# Patient Record
Sex: Male | Born: 1945 | Race: White | Hispanic: No | Marital: Married | Smoking: Never smoker
Health system: Southern US, Community
[De-identification: ages and names within clinical notes are randomized; demographics above are authoritative.]

## PROBLEM LIST (undated history)

## (undated) DIAGNOSIS — M1990 Unspecified osteoarthritis, unspecified site: Secondary | ICD-10-CM

## (undated) DIAGNOSIS — I10 Essential (primary) hypertension: Secondary | ICD-10-CM

## (undated) DIAGNOSIS — K429 Umbilical hernia without obstruction or gangrene: Secondary | ICD-10-CM

## (undated) DIAGNOSIS — G4730 Sleep apnea, unspecified: Secondary | ICD-10-CM

## (undated) HISTORY — PX: EYE SURGERY: SHX253

## (undated) HISTORY — PX: OTHER SURGICAL HISTORY: SHX169

---

## 2014-08-22 HISTORY — PX: JOINT REPLACEMENT: SHX530

## 2016-08-22 HISTORY — PX: REPLACEMENT TOTAL KNEE: SUR1224

## 2018-07-02 ENCOUNTER — Other Ambulatory Visit: Payer: Self-pay | Admitting: Neurological Surgery

## 2018-08-23 NOTE — Pre-Procedure Instructions (Signed)
Joe Ramos  08/23/2018      [facility] - [place], [place] - [address] [place] [address] Phone: [phone] Fax: [phone]    Your procedure is scheduled on January 8  Report to [facility] [employer] A.M.  Call this number if you have problems the morning of surgery:  [phone]   Remember:  Do not eat or drink after midnight.      Take these medicines the morning of surgery with A SIP OF WATER NONE  7 days prior to surgery STOP taking any Aspirin (unless otherwise instructed by your surgeon), Aleve, Naproxen, Ibuprofen, Motrin, Advil, Goody's, BC's, all herbal medications, fish oil, and all vitamins.   Follow your surgeon's instructions on when to stop Asprin.  If no instructions were given by your surgeon then you will need to call the office to get those instructions.      Do not wear jewelry  Do not wear lotions, powders, or cologne, or deodorant.   Men may shave face and neck.  Do not bring valuables to the hospital.  [facility] is not responsible for any belongings or valuables.  Contacts, dentures or bridgework may not be worn into surgery.  Leave your suitcase in the car.  After surgery it may be brought to your room.  For patients admitted to the hospital, discharge time will be determined by your treatment team.  Patients discharged the day of surgery will not be allowed to drive home.    Special instructions:   [facility]- Preparing For Surgery  Before surgery, you can play an important role. Because skin is not sterile, your skin needs to be as free of germs as possible. You can reduce the number of germs on your skin by washing with CHG (chlorahexidine gluconate) Soap before surgery.  CHG is an antiseptic cleaner which kills germs and bonds with the skin to continue killing germs even after washing.    Oral Hygiene is also important to reduce your risk of infection.  Remember - BRUSH YOUR  TEETH THE MORNING OF SURGERY WITH YOUR REGULAR TOOTHPASTE  Please do not use if you have an allergy to CHG or antibacterial soaps. If your skin becomes reddened/irritated stop using the CHG.  Do not shave (including legs and underarms) for at least 48 hours prior to first CHG shower. It is OK to shave your face.  Please follow these instructions carefully.   1. Shower the NIGHT BEFORE SURGERY and the MORNING OF SURGERY with CHG.   2. If you chose to wash your hair, wash your hair first as usual with your normal shampoo.  3. After you shampoo, rinse your hair and body thoroughly to remove the shampoo.  4. Use CHG as you would any other liquid soap. You can apply CHG directly to the skin and wash gently with a scrungie or a clean washcloth.   5. Apply the CHG Soap to your body ONLY FROM THE NECK DOWN.  Do not use on open wounds or open sores. Avoid contact with your eyes, ears, mouth and genitals (private parts). Wash Face and genitals (private parts)  with your normal soap.  6. Wash thoroughly, paying special attention to the area where your surgery will be performed.  7. Thoroughly rinse your body with warm water from the neck down.  8. DO NOT shower/wash with your normal soap after using and rinsing off the CHG Soap.  9. Joe Ramos  yourself dry with a CLEAN TOWEL.  10. Wear CLEAN PAJAMAS to bed the night before surgery, wear comfortable clothes the morning of surgery  11. Place CLEAN SHEETS on your bed the night of your first shower and DO NOT SLEEP WITH PETS.    Day of Surgery:  Do not apply any deodorants/lotions.  Please wear clean clothes to the hospital/surgery center.   Remember to brush your teeth WITH YOUR REGULAR TOOTHPASTE.    Please read over the following fact sheets that you were given.

## 2018-08-24 ENCOUNTER — Other Ambulatory Visit: Payer: Self-pay

## 2018-08-24 ENCOUNTER — Encounter (HOSPITAL_COMMUNITY)
Admission: RE | Admit: 2018-08-24 | Discharge: 2018-08-24 | Disposition: A | Discharge status: Home / Self Care | Payer: Medicare Other | Source: Ambulatory Visit | Attending: Neurological Surgery | Admitting: Neurological Surgery

## 2018-08-24 ENCOUNTER — Ambulatory Visit (HOSPITAL_COMMUNITY)
Admission: RE | Admit: 2018-08-24 | Discharge: 2018-08-24 | Disposition: A | Discharge status: Home / Self Care | Payer: Medicare Other | Source: Ambulatory Visit | Attending: Neurological Surgery | Admitting: Neurological Surgery

## 2018-08-24 DIAGNOSIS — M4310 Spondylolisthesis, site unspecified: Secondary | ICD-10-CM

## 2018-08-24 LAB — CBC WITH DIFFERENTIAL/PLATELET
Abs Immature Granulocytes: 0.02 10*3/uL (ref 0.00–0.07)
BASOS ABS: 0 10*3/uL (ref 0.0–0.1)
Basophils Relative: 1 %
EOS ABS: 0.2 10*3/uL (ref 0.0–0.5)
Eosinophils Relative: 3 %
HCT: 42.3 % (ref 39.0–52.0)
Hemoglobin: 13.9 g/dL (ref 13.0–17.0)
Immature Granulocytes: 0 %
Lymphocytes Relative: 21 %
Lymphs Abs: 1.4 10*3/uL (ref 0.7–4.0)
MCH: 30.3 pg (ref 26.0–34.0)
MCHC: 32.9 g/dL (ref 30.0–36.0)
MCV: 92.4 fL (ref 80.0–100.0)
Monocytes Absolute: 0.7 10*3/uL (ref 0.1–1.0)
Monocytes Relative: 11 %
NRBC: 0 % (ref 0.0–0.2)
Neutro Abs: 4.2 10*3/uL (ref 1.7–7.7)
Neutrophils Relative %: 64 %
PLATELETS: 185 10*3/uL (ref 150–400)
RBC: 4.58 MIL/uL (ref 4.22–5.81)
RDW: 12.6 % (ref 11.5–15.5)
WBC: 6.5 10*3/uL (ref 4.0–10.5)

## 2018-08-24 LAB — SURGICAL PCR SCREEN
MRSA, PCR: NEGATIVE
Staphylococcus aureus: NEGATIVE

## 2018-08-24 LAB — TYPE AND SCREEN
ABO/RH(D): A POS
Antibody Screen: NEGATIVE

## 2018-08-24 LAB — PROTIME-INR
INR: 1.16
Prothrombin Time: 14.7 seconds (ref 11.4–15.2)

## 2018-08-24 LAB — ABO/RH: ABO/RH(D): A POS

## 2018-08-24 NOTE — Progress Notes (Signed)
PCP - [facility] Cardiologist - Dr. Blanch Media  Chest x-ray - 08/24/18 EKG - 08/24/18 Stress Test - denies ECHO - requesting Cardiac Cath - denies  Aspirin Instructions:stop 5 days  Anesthesia review: records requested  Patient denies shortness of breath, fever, cough and chest pain at PAT appointment   Patient verbalized understanding of instructions that were given to them at the PAT appointment. Patient was also instructed that they will need to review over the PAT instructions again at home before surgery.

## 2018-08-27 NOTE — Anesthesia Preprocedure Evaluation (Addendum)
Anesthesia Evaluation  Patient identified by MRN, date of birth, ID band Patient awake    Reviewed: Allergy & Precautions, NPO status , Patient's Chart, lab work & pertinent test results  Airway Mallampati: I  TM Distance: >3 FB Neck ROM: Full    Dental   Pulmonary sleep apnea ,    Pulmonary exam normal        Cardiovascular hypertension, Pt. on medications Normal cardiovascular exam     Neuro/Psych    GI/Hepatic   Endo/Other    Renal/GU      Musculoskeletal   Abdominal   Peds  Hematology   Anesthesia Other Findings   Reproductive/Obstetrics                             Anesthesia Physical Anesthesia Plan  ASA: III  Anesthesia Plan: General   Post-op Pain Management:    Induction: Intravenous  PONV Risk Score and Plan: 2 and Ondansetron, Midazolam and Dexamethasone  Airway Management Planned: Oral ETT  Additional Equipment:   Intra-op Plan:   Post-operative Plan: Extubation in OR  Informed Consent: I have reviewed the patients History and Physical, chart, labs and discussed the procedure including the risks, benefits and alternatives for the proposed anesthesia with the patient or authorized representative who has indicated his/her understanding and acceptance.     Plan Discussed with: CRNA and Surgeon  Anesthesia Plan Comments: (Pt follows with cardiologist in [place], Dr. Rinaldo Ratel, for management of HTN and HLD. Per last OV note 08/28/2017 he has no cardiac history, only seen for risk factor modification.)       Anesthesia Quick Evaluation

## 2018-08-29 ENCOUNTER — Inpatient Hospital Stay (HOSPITAL_COMMUNITY): Payer: Medicare Other | Admitting: Certified Registered Nurse Anesthetist

## 2018-08-29 ENCOUNTER — Inpatient Hospital Stay (HOSPITAL_COMMUNITY): Payer: Medicare Other

## 2018-08-29 ENCOUNTER — Inpatient Hospital Stay (HOSPITAL_COMMUNITY)
Admission: RE | Disposition: A | Discharge status: Home / Self Care | Payer: Self-pay | Source: Home / Self Care | Attending: Neurological Surgery

## 2018-08-29 ENCOUNTER — Encounter (HOSPITAL_COMMUNITY): Payer: Self-pay | Admitting: *Deleted

## 2018-08-29 ENCOUNTER — Inpatient Hospital Stay (HOSPITAL_COMMUNITY)
Admission: RE | Admit: 2018-08-29 | Discharge: 2018-08-30 | DRG: 455 | Disposition: A | Discharge status: Home / Self Care | Payer: Medicare Other | Attending: Neurological Surgery | Admitting: Neurological Surgery

## 2018-08-29 ENCOUNTER — Other Ambulatory Visit: Payer: Self-pay

## 2018-08-29 ENCOUNTER — Inpatient Hospital Stay (HOSPITAL_COMMUNITY): Payer: Medicare Other | Admitting: Physician Assistant

## 2018-08-29 DIAGNOSIS — I10 Essential (primary) hypertension: Secondary | ICD-10-CM | POA: Diagnosis present

## 2018-08-29 DIAGNOSIS — Z419 Encounter for procedure for purposes other than remedying health state, unspecified: Secondary | ICD-10-CM

## 2018-08-29 DIAGNOSIS — Z981 Arthrodesis status: Secondary | ICD-10-CM

## 2018-08-29 DIAGNOSIS — Z888 Allergy status to other drugs, medicaments and biological substances status: Secondary | ICD-10-CM | POA: Diagnosis not present

## 2018-08-29 DIAGNOSIS — Z96653 Presence of artificial knee joint, bilateral: Secondary | ICD-10-CM | POA: Diagnosis present

## 2018-08-29 DIAGNOSIS — M4316 Spondylolisthesis, lumbar region: Principal | ICD-10-CM | POA: Diagnosis present

## 2018-08-29 DIAGNOSIS — Z79899 Other long term (current) drug therapy: Secondary | ICD-10-CM | POA: Diagnosis not present

## 2018-08-29 DIAGNOSIS — G4730 Sleep apnea, unspecified: Secondary | ICD-10-CM | POA: Diagnosis present

## 2018-08-29 DIAGNOSIS — Z885 Allergy status to narcotic agent status: Secondary | ICD-10-CM

## 2018-08-29 DIAGNOSIS — Z7982 Long term (current) use of aspirin: Secondary | ICD-10-CM | POA: Diagnosis not present

## 2018-08-29 DIAGNOSIS — M48061 Spinal stenosis, lumbar region without neurogenic claudication: Secondary | ICD-10-CM | POA: Diagnosis present

## 2018-08-29 HISTORY — DX: Sleep apnea, unspecified: G47.30

## 2018-08-29 HISTORY — DX: Essential (primary) hypertension: I10

## 2018-08-29 HISTORY — DX: Umbilical hernia without obstruction or gangrene: K42.9

## 2018-08-29 HISTORY — DX: Unspecified osteoarthritis, unspecified site: M19.90

## 2018-08-29 SURGERY — POSTERIOR LUMBAR FUSION 1 LEVEL
Anesthesia: General | Site: Spine Lumbar

## 2018-08-29 MED ORDER — METHOCARBAMOL 500 MG PO TABS
500.0000 mg | ORAL_TABLET | Freq: Four times a day (QID) | ORAL | Status: DC | PRN
  Administered 2018-08-30: 500 mg via ORAL
  Filled 2018-08-29: qty 1

## 2018-08-29 MED ORDER — SUGAMMADEX SODIUM 200 MG/2ML IV SOLN
INTRAVENOUS | Status: DC | PRN
  Administered 2018-08-29: 400 mg via INTRAVENOUS

## 2018-08-29 MED ORDER — PHENYLEPHRINE 40 MCG/ML (10ML) SYRINGE FOR IV PUSH (FOR BLOOD PRESSURE SUPPORT)
PREFILLED_SYRINGE | INTRAVENOUS | Status: AC
  Filled 2018-08-29: qty 10

## 2018-08-29 MED ORDER — CHLORHEXIDINE GLUCONATE CLOTH 2 % EX PADS: 6.0000 | MEDICATED_PAD | Freq: Once | CUTANEOUS | Status: DC

## 2018-08-29 MED ORDER — THROMBIN 20000 UNITS EX SOLR
CUTANEOUS | Status: DC | PRN
  Administered 2018-08-29: 20 mL via TOPICAL

## 2018-08-29 MED ORDER — CELECOXIB 200 MG PO CAPS
200.0000 mg | ORAL_CAPSULE | Freq: Two times a day (BID) | ORAL | Status: DC
  Administered 2018-08-29 – 2018-08-30 (×3): 200 mg via ORAL
  Filled 2018-08-29 (×3): qty 1

## 2018-08-29 MED ORDER — HEPARIN SODIUM (PORCINE) 1000 UNIT/ML IJ SOLN
INTRAMUSCULAR | Status: DC | PRN
  Administered 2018-08-29: 50000 [IU] via INTRAVENOUS

## 2018-08-29 MED ORDER — OXYCODONE HCL 5 MG PO TABS: 5.0000 mg | ORAL_TABLET | ORAL | Status: DC | PRN

## 2018-08-29 MED ORDER — ONDANSETRON HCL 4 MG/2ML IJ SOLN
INTRAMUSCULAR | Status: DC | PRN
  Administered 2018-08-29: 4 mg via INTRAVENOUS

## 2018-08-29 MED ORDER — FENTANYL CITRATE (PF) 250 MCG/5ML IJ SOLN
INTRAMUSCULAR | Status: AC
  Filled 2018-08-29: qty 5

## 2018-08-29 MED ORDER — MIDAZOLAM HCL 2 MG/2ML IJ SOLN
INTRAMUSCULAR | Status: AC
  Filled 2018-08-29: qty 2

## 2018-08-29 MED ORDER — HYDROCHLOROTHIAZIDE 12.5 MG PO CAPS
12.5000 mg | ORAL_CAPSULE | Freq: Every day | ORAL | Status: DC
  Administered 2018-08-29: 12.5 mg via ORAL
  Filled 2018-08-29: qty 1

## 2018-08-29 MED ORDER — ONDANSETRON HCL 4 MG/2ML IJ SOLN: 4.0000 mg | Freq: Four times a day (QID) | INTRAMUSCULAR | Status: DC | PRN

## 2018-08-29 MED ORDER — CEFAZOLIN SODIUM-DEXTROSE 2-4 GM/100ML-% IV SOLN
2.0000 g | Freq: Three times a day (TID) | INTRAVENOUS | Status: AC
  Administered 2018-08-29 (×2): 2 g via INTRAVENOUS
  Filled 2018-08-29 (×2): qty 100

## 2018-08-29 MED ORDER — BUPIVACAINE HCL (PF) 0.25 % IJ SOLN
INTRAMUSCULAR | Status: AC
  Filled 2018-08-29: qty 30

## 2018-08-29 MED ORDER — ACETAMINOPHEN 650 MG RE SUPP: 650.0000 mg | RECTAL | Status: DC | PRN

## 2018-08-29 MED ORDER — ROCURONIUM BROMIDE 100 MG/10ML IV SOLN
INTRAVENOUS | Status: DC | PRN
  Administered 2018-08-29: 60 mg via INTRAVENOUS
  Administered 2018-08-29: 40 mg via INTRAVENOUS

## 2018-08-29 MED ORDER — PROPOFOL 10 MG/ML IV BOLUS
INTRAVENOUS | Status: AC
  Filled 2018-08-29: qty 20

## 2018-08-29 MED ORDER — EZETIMIBE-SIMVASTATIN 10-20 MG PO TABS
1.0000 | ORAL_TABLET | Freq: Every day | ORAL | Status: DC
  Administered 2018-08-29: 1 via ORAL
  Filled 2018-08-29 (×2): qty 1

## 2018-08-29 MED ORDER — SODIUM CHLORIDE 0.9 % IV SOLN
INTRAVENOUS | Status: DC | PRN
  Administered 2018-08-29: 500 mL

## 2018-08-29 MED ORDER — HYDROMORPHONE HCL 1 MG/ML IJ SOLN
INTRAMUSCULAR | Status: AC
  Administered 2018-08-29: 0.5 mg via INTRAVENOUS
  Filled 2018-08-29: qty 1

## 2018-08-29 MED ORDER — SODIUM CHLORIDE 0.9% FLUSH: 3.0000 mL | INTRAVENOUS | Status: DC | PRN

## 2018-08-29 MED ORDER — LOSARTAN POTASSIUM 50 MG PO TABS
50.0000 mg | ORAL_TABLET | Freq: Every day | ORAL | Status: DC
  Administered 2018-08-29: 50 mg via ORAL
  Filled 2018-08-29: qty 1

## 2018-08-29 MED ORDER — THROMBIN 20000 UNITS EX SOLR
CUTANEOUS | Status: AC
  Filled 2018-08-29: qty 20000

## 2018-08-29 MED ORDER — VANCOMYCIN HCL 1000 MG IV SOLR
INTRAVENOUS | Status: DC | PRN
  Administered 2018-08-29: 1000 mg via TOPICAL

## 2018-08-29 MED ORDER — VANCOMYCIN HCL 1000 MG IV SOLR
INTRAVENOUS | Status: AC
  Filled 2018-08-29: qty 1000

## 2018-08-29 MED ORDER — SODIUM CHLORIDE 0.9% FLUSH
3.0000 mL | Freq: Two times a day (BID) | INTRAVENOUS | Status: DC
  Administered 2018-08-29: 3 mL via INTRAVENOUS

## 2018-08-29 MED ORDER — LIDOCAINE 2% (20 MG/ML) 5 ML SYRINGE
INTRAMUSCULAR | Status: AC
  Filled 2018-08-29: qty 5

## 2018-08-29 MED ORDER — MORPHINE SULFATE (PF) 2 MG/ML IV SOLN: 2.0000 mg | INTRAVENOUS | Status: DC | PRN

## 2018-08-29 MED ORDER — METHOCARBAMOL 1000 MG/10ML IJ SOLN
500.0000 mg | Freq: Four times a day (QID) | INTRAVENOUS | Status: DC | PRN
  Filled 2018-08-29: qty 5

## 2018-08-29 MED ORDER — EPHEDRINE 5 MG/ML INJ
INTRAVENOUS | Status: AC
  Filled 2018-08-29: qty 10

## 2018-08-29 MED ORDER — LIDOCAINE HCL (CARDIAC) PF 100 MG/5ML IV SOSY
PREFILLED_SYRINGE | INTRAVENOUS | Status: DC | PRN
  Administered 2018-08-29: 80 mg via INTRAVENOUS

## 2018-08-29 MED ORDER — DEXAMETHASONE SODIUM PHOSPHATE 10 MG/ML IJ SOLN
10.0000 mg | INTRAMUSCULAR | Status: AC
  Administered 2018-08-29: 10 mg via INTRAVENOUS
  Filled 2018-08-29: qty 1

## 2018-08-29 MED ORDER — HYDROMORPHONE HCL 1 MG/ML IJ SOLN
0.5000 mg | INTRAMUSCULAR | Status: DC | PRN
  Administered 2018-08-29 (×2): 0.5 mg via INTRAVENOUS

## 2018-08-29 MED ORDER — NON FORMULARY
Status: DC | PRN
  Administered 2018-08-29: 10 mL

## 2018-08-29 MED ORDER — ONDANSETRON HCL 4 MG/2ML IJ SOLN
INTRAMUSCULAR | Status: AC
  Filled 2018-08-29: qty 2

## 2018-08-29 MED ORDER — PHENOL 1.4 % MT LIQD: 1.0000 | OROMUCOSAL | Status: DC | PRN

## 2018-08-29 MED ORDER — DEXAMETHASONE SODIUM PHOSPHATE 4 MG/ML IJ SOLN
4.0000 mg | Freq: Four times a day (QID) | INTRAMUSCULAR | Status: DC
  Administered 2018-08-29: 4 mg via INTRAVENOUS
  Filled 2018-08-29: qty 1

## 2018-08-29 MED ORDER — MIDAZOLAM HCL 5 MG/5ML IJ SOLN
INTRAMUSCULAR | Status: DC | PRN
  Administered 2018-08-29: 2 mg via INTRAVENOUS

## 2018-08-29 MED ORDER — ONDANSETRON HCL 4 MG PO TABS: 4.0000 mg | ORAL_TABLET | Freq: Four times a day (QID) | ORAL | Status: DC | PRN

## 2018-08-29 MED ORDER — THROMBIN 5000 UNITS EX SOLR
OROMUCOSAL | Status: DC | PRN
  Administered 2018-08-29: 5 mL via TOPICAL

## 2018-08-29 MED ORDER — ROCURONIUM BROMIDE 50 MG/5ML IV SOSY
PREFILLED_SYRINGE | INTRAVENOUS | Status: AC
  Filled 2018-08-29: qty 10

## 2018-08-29 MED ORDER — POTASSIUM CHLORIDE IN NACL 20-0.9 MEQ/L-% IV SOLN: INTRAVENOUS | Status: DC

## 2018-08-29 MED ORDER — SENNA 8.6 MG PO TABS
1.0000 | ORAL_TABLET | Freq: Two times a day (BID) | ORAL | Status: DC
  Administered 2018-08-29 (×2): 8.6 mg via ORAL
  Filled 2018-08-29 (×2): qty 1

## 2018-08-29 MED ORDER — 0.9 % SODIUM CHLORIDE (POUR BTL) OPTIME
TOPICAL | Status: DC | PRN
  Administered 2018-08-29: 1000 mL

## 2018-08-29 MED ORDER — ACETAMINOPHEN 325 MG PO TABS
650.0000 mg | ORAL_TABLET | ORAL | Status: DC | PRN
  Administered 2018-08-30: 650 mg via ORAL
  Filled 2018-08-29: qty 2

## 2018-08-29 MED ORDER — DEXAMETHASONE 4 MG PO TABS
4.0000 mg | ORAL_TABLET | Freq: Four times a day (QID) | ORAL | Status: DC
  Administered 2018-08-29 – 2018-08-30 (×3): 4 mg via ORAL
  Filled 2018-08-29 (×3): qty 1

## 2018-08-29 MED ORDER — MENTHOL 3 MG MT LOZG
1.0000 | LOZENGE | OROMUCOSAL | Status: DC | PRN
  Filled 2018-08-29: qty 9

## 2018-08-29 MED ORDER — VITAMIN D 25 MCG (1000 UNIT) PO TABS: 5000.0000 [IU] | ORAL_TABLET | Freq: Every day | ORAL | Status: DC

## 2018-08-29 MED ORDER — LACTATED RINGERS IV SOLN
INTRAVENOUS | Status: DC | PRN
  Administered 2018-08-29 (×2): via INTRAVENOUS

## 2018-08-29 MED ORDER — LOSARTAN POTASSIUM-HCTZ 50-12.5 MG PO TABS: 1.0000 | ORAL_TABLET | Freq: Every day | ORAL | Status: DC

## 2018-08-29 MED ORDER — EPHEDRINE SULFATE 50 MG/ML IJ SOLN
INTRAMUSCULAR | Status: DC | PRN
  Administered 2018-08-29 (×2): 10 mg via INTRAVENOUS

## 2018-08-29 MED ORDER — HEPARIN SODIUM (PORCINE) 1000 UNIT/ML IJ SOLN
INTRAMUSCULAR | Status: AC
  Filled 2018-08-29: qty 1

## 2018-08-29 MED ORDER — THROMBIN 5000 UNITS EX SOLR
CUTANEOUS | Status: AC
  Filled 2018-08-29: qty 5000

## 2018-08-29 MED ORDER — FENTANYL CITRATE (PF) 100 MCG/2ML IJ SOLN
INTRAMUSCULAR | Status: DC | PRN
  Administered 2018-08-29: 100 ug via INTRAVENOUS
  Administered 2018-08-29: 50 ug via INTRAVENOUS

## 2018-08-29 MED ORDER — SODIUM CHLORIDE 0.9 % IV SOLN: 250.0000 mL | INTRAVENOUS | Status: DC

## 2018-08-29 MED ORDER — BUPIVACAINE HCL (PF) 0.25 % IJ SOLN
INTRAMUSCULAR | Status: DC | PRN
  Administered 2018-08-29: 5 mL

## 2018-08-29 MED ORDER — CEFAZOLIN SODIUM-DEXTROSE 2-4 GM/100ML-% IV SOLN
2.0000 g | INTRAVENOUS | Status: AC
  Administered 2018-08-29: 2 g via INTRAVENOUS
  Filled 2018-08-29: qty 100

## 2018-08-29 MED ORDER — DEXAMETHASONE SODIUM PHOSPHATE 10 MG/ML IJ SOLN
INTRAMUSCULAR | Status: AC
  Filled 2018-08-29: qty 1

## 2018-08-29 MED ORDER — EPHEDRINE 5 MG/ML INJ
INTRAVENOUS | Status: AC
  Filled 2018-08-29: qty 20

## 2018-08-29 MED ORDER — PROPOFOL 10 MG/ML IV BOLUS
INTRAVENOUS | Status: DC | PRN
  Administered 2018-08-29: 160 mg via INTRAVENOUS

## 2018-08-29 MED ORDER — ASPIRIN 325 MG PO TABS
325.0000 mg | ORAL_TABLET | Freq: Every day | ORAL | Status: DC
  Administered 2018-08-29: 325 mg via ORAL
  Filled 2018-08-29 (×2): qty 1

## 2018-08-29 MED FILL — Anticoagulant Sodium Citrate Soln 4%: Qty: 10 | Status: AC

## 2018-08-29 SURGICAL SUPPLY — 71 items
BAG DECANTER FOR FLEXI CONT (MISCELLANEOUS) ×3 IMPLANT
BASKET BONE COLLECTION (BASKET) ×3 IMPLANT
BENZOIN TINCTURE PRP APPL 2/3 (GAUZE/BANDAGES/DRESSINGS) ×3 IMPLANT
BLADE CLIPPER SURG (BLADE) IMPLANT
BUR MATCHSTICK NEURO 3.0 LAGG (BURR) ×3 IMPLANT
CANISTER SUCT 3000ML PPV (MISCELLANEOUS) ×3 IMPLANT
CARTRIDGE OIL MAESTRO DRILL (MISCELLANEOUS) ×1 IMPLANT
CLOSURE WOUND 1/2 X4 (GAUZE/BANDAGES/DRESSINGS) ×2
CONT SPEC 4OZ CLIKSEAL STRL BL (MISCELLANEOUS) ×3 IMPLANT
COVER BACK TABLE 60X90IN (DRAPES) ×3 IMPLANT
COVER WAND RF STERILE (DRAPES) ×1 IMPLANT
DERMABOND ADVANCED (GAUZE/BANDAGES/DRESSINGS) ×2
DERMABOND ADVANCED .7 DNX12 (GAUZE/BANDAGES/DRESSINGS) ×1 IMPLANT
DIFFUSER DRILL AIR PNEUMATIC (MISCELLANEOUS) ×3 IMPLANT
DRAPE C-ARM 42X72 X-RAY (DRAPES) ×4 IMPLANT
DRAPE LAPAROTOMY 100X72X124 (DRAPES) ×3 IMPLANT
DRAPE POUCH INSTRU U-SHP 10X18 (DRAPES) ×1 IMPLANT
DRAPE SURG 17X23 STRL (DRAPES) ×3 IMPLANT
DRSG OPSITE POSTOP 4X6 (GAUZE/BANDAGES/DRESSINGS) ×2 IMPLANT
DURAPREP 26ML APPLICATOR (WOUND CARE) ×3 IMPLANT
ELECT REM PT RETURN 9FT ADLT (ELECTROSURGICAL) ×3
ELECTRODE REM PT RTRN 9FT ADLT (ELECTROSURGICAL) ×1 IMPLANT
EVACUATOR 1/8 PVC DRAIN (DRAIN) ×1 IMPLANT
GAUZE 4X4 16PLY RFD (DISPOSABLE) IMPLANT
GLOVE BIO SURGEON STRL SZ7 (GLOVE) ×2 IMPLANT
GLOVE BIO SURGEON STRL SZ8 (GLOVE) ×6 IMPLANT
GLOVE BIOGEL PI IND STRL 6.5 (GLOVE) IMPLANT
GLOVE BIOGEL PI IND STRL 7.0 (GLOVE) IMPLANT
GLOVE BIOGEL PI IND STRL 8 (GLOVE) IMPLANT
GLOVE BIOGEL PI INDICATOR 6.5 (GLOVE) ×2
GLOVE BIOGEL PI INDICATOR 7.0 (GLOVE) ×4
GLOVE BIOGEL PI INDICATOR 8 (GLOVE) ×2
GLOVE SURG SS PI 6.0 STRL IVOR (GLOVE) ×2 IMPLANT
GLOVE SURG SS PI 7.5 STRL IVOR (GLOVE) ×10 IMPLANT
GOWN STRL REUS W/ TWL LRG LVL3 (GOWN DISPOSABLE) IMPLANT
GOWN STRL REUS W/ TWL XL LVL3 (GOWN DISPOSABLE) ×2 IMPLANT
GOWN STRL REUS W/TWL 2XL LVL3 (GOWN DISPOSABLE) IMPLANT
GOWN STRL REUS W/TWL LRG LVL3 (GOWN DISPOSABLE) ×8
GOWN STRL REUS W/TWL XL LVL3 (GOWN DISPOSABLE) ×4
HEMOSTAT POWDER KIT SURGIFOAM (HEMOSTASIS) ×2 IMPLANT
KIT BASIN OR (CUSTOM PROCEDURE TRAY) ×3 IMPLANT
KIT BONE MRW ASP ANGEL CPRP (KITS) ×2 IMPLANT
KIT TURNOVER KIT B (KITS) ×3 IMPLANT
MILL MEDIUM DISP (BLADE) ×1 IMPLANT
NDL BLUNT 18X1 FOR OR ONLY (NEEDLE) IMPLANT
NDL HYPO 25X1 1.5 SAFETY (NEEDLE) ×1 IMPLANT
NEEDLE BLUNT 18X1 FOR OR ONLY (NEEDLE) ×6 IMPLANT
NEEDLE HYPO 25X1 1.5 SAFETY (NEEDLE) ×3 IMPLANT
NS IRRIG 1000ML POUR BTL (IV SOLUTION) ×3 IMPLANT
OIL CARTRIDGE MAESTRO DRILL (MISCELLANEOUS) ×3
PACK LAMINECTOMY NEURO (CUSTOM PROCEDURE TRAY) ×3 IMPLANT
PAD ARMBOARD 7.5X6 YLW CONV (MISCELLANEOUS) ×13 IMPLANT
PUTTY DBM ALLOSYNC PURE 10CC (Putty) ×2 IMPLANT
ROD LORD TI 5.5X35 (Rod) ×4 IMPLANT
SCREW KODIAK 6.5X40 (Screw) ×4 IMPLANT
SCREW KODIAK 6.5X45 (Screw) ×4 IMPLANT
SET SCREW (Screw) ×8 IMPLANT
SET SCREW SPNE (Screw) IMPLANT
SPACER IDENTITI PS 11X9X30 10D (Spacer) ×4 IMPLANT
SPONGE LAP 4X18 RFD (DISPOSABLE) IMPLANT
SPONGE SURGIFOAM ABS GEL 100 (HEMOSTASIS) ×3 IMPLANT
STRIP CLOSURE SKIN 1/2X4 (GAUZE/BANDAGES/DRESSINGS) ×4 IMPLANT
SUT VIC AB 0 CT1 18XCR BRD8 (SUTURE) ×1 IMPLANT
SUT VIC AB 0 CT1 8-18 (SUTURE) ×2
SUT VIC AB 2-0 CP2 18 (SUTURE) ×3 IMPLANT
SUT VIC AB 3-0 SH 8-18 (SUTURE) ×6 IMPLANT
SYR 20CC LL (SYRINGE) ×2 IMPLANT
TOWEL GREEN STERILE (TOWEL DISPOSABLE) ×3 IMPLANT
TOWEL GREEN STERILE FF (TOWEL DISPOSABLE) ×3 IMPLANT
TRAY FOLEY MTR SLVR 16FR STAT (SET/KITS/TRAYS/PACK) ×3 IMPLANT
WATER STERILE IRR 1000ML POUR (IV SOLUTION) ×3 IMPLANT

## 2018-08-29 NOTE — Transfer of Care (Signed)
Immediate Anesthesia Transfer of Care Note  Patient: Joe Ramos  Procedure(s) Performed: Posterior Lumbar Interbody Fusion lumbar Four-Five - Posterior Lateral and Interbody fusion (N/A Spine Lumbar)  Patient Location: PACU  Anesthesia Type:General  Level of Consciousness: awake and alert   Airway & Oxygen Therapy: Patient Spontanous Breathing and Patient connected to nasal cannula oxygen  Post-op Assessment: Report given to RN, Post -op Vital signs reviewed and stable and Patient moving all extremities  Post vital signs: Reviewed and stable  Last Vitals:  Vitals Value Taken Time  BP 157/80 08/29/2018 11:32 AM  Temp    Pulse 84 08/29/2018 11:34 AM  Resp 20 08/29/2018 11:34 AM  SpO2 100 % 08/29/2018 11:34 AM  Vitals shown include unvalidated device data.  Last Pain:  Vitals:   08/29/18 1134  TempSrc:   PainSc: (P) 0-No pain         Complications: No apparent anesthesia complications

## 2018-08-29 NOTE — Anesthesia Procedure Notes (Signed)
Procedure Name: Intubation Date/Time: 08/29/2018 8:49 AM Performed by: Aveer Bartow T, CRNA Pre-anesthesia Checklist: Patient identified, Emergency Drugs available and Patient being monitored Patient Re-evaluated:Patient Re-evaluated prior to induction Oxygen Delivery Method: Circle system utilized Preoxygenation: Pre-oxygenation with 100% oxygen Induction Type: IV induction Ventilation: Mask ventilation without difficulty Laryngoscope Size: Miller and 3 Grade View: Grade I Tube type: Oral Tube size: 7.5 mm Number of attempts: 1 Airway Equipment and Method: Patient positioned with wedge pillow and Stylet Placement Confirmation: ETT inserted through vocal cords under direct vision,  positive ETCO2 and breath sounds checked- equal and bilateral Secured at: 23 cm Tube secured with: Tape Dental Injury: Teeth and Oropharynx as per pre-operative assessment

## 2018-08-29 NOTE — Addendum Note (Signed)
Addendum  created 08/29/18 1844 by Reine Just, CRNA   Intraprocedure Event edited

## 2018-08-29 NOTE — Op Note (Signed)
08/29/2018  11:23 AM  PATIENT:  Joe Ramos  73 y.o. 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 Decompressive lumbar laminectomy L4-5 requiring more work than would be required for a simple exposure of the disk for PLIF in order to adequately decompress the neural elements and address the spinal stenosis 2. Posterior lumbar interbody fusion L4-5 using porous titanium interbody cages packed with morcellized allograft and autograft soaked with a bone marrow aspirate obtained through a separate fascial incision over the right iliac crest 3. Posterior fixation L4-5 using Alphatec cortical pedicle screws.  4. Intertransverse arthrodesis L4 5 using morcellized autograft and allograft.  SURGEON:  Marikay Alar, MD  ASSISTANTS: Verlin Dike FNP  ANESTHESIA:  General  EBL: 200 ml  Total I/O In: 1000 [I.V.:1000] Out: 260 [Urine:60; Blood:200]  BLOOD ADMINISTERED:none  DRAINS: none   INDICATION FOR PROCEDURE: This patient presented with back and bilateral leg pain. Imaging revealed severe spinal stenosis at L4-5 with a mobile degenerative spondylolisthesis. The patient tried a reasonable attempt at conservative medical measures without relief. I recommended decompression and instrumented fusion to address the stenosis as well as the segmental  instability.  Patient understood the risks, benefits, and alternatives and potential outcomes and wished to proceed.  PROCEDURE DETAILS:  The patient was brought to the operating room. After induction of generalized endotracheal anesthesia the patient was rolled into the prone position on chest rolls and all pressure points were padded. The patient's lumbar region was cleaned and then prepped with DuraPrep and draped in the usual sterile fashion. Anesthesia was injected and then a dorsal midline incision was made and carried  down to the lumbosacral fascia. The fascia was opened and the paraspinous musculature was taken down in a subperiosteal fashion to expose L4-5. A self-retaining retractor was placed. Intraoperative fluoroscopy confirmed my level, and I started with placement of the L4 cortical pedicle screws. The pedicle screw entry zones were identified utilizing surface landmarks and  AP and lateral fluoroscopy. I scored the cortex with the high-speed drill and then used the hand drill to drill an upward and outward direction into the pedicle. I then tapped line to line. I then placed a 6.5 x 40 mm cortical pedicle screw into the pedicles of L4 bilaterally.  I then dissected in a suprafascial plane to expose the iliac crest.  Open the fascia used a Jamshidi needle to extract 60 cc of bone marrow aspirate from the iliac crest.  This was then spun down by [facility] device and 2 to 4 cc of  BMAC was soaked on morselized allograft for later arthrodesis.  I dried the hole with Surgifoam and closed the fascia.  I then turned my attention to the decompression and complete lumbar laminectomies, hemi- facetectomies, and foraminotomies were performed at L4-5. The patient had significant spinal stenosis and this required more work than would be required for a simple exposure of the disc for posterior lumbar interbody fusion which would only require a limited laminotomy. Much more generous decompression and generous foraminotomy was undertaken in order to adequately decompress the neural elements and address the patient's leg pain. The yellow ligament was removed to expose the underlying dura and nerve roots, and generous foraminotomies were performed to adequately decompress the neural elements. Both the exiting and traversing nerve roots were decompressed on both sides until a coronary dilator passed easily along the nerve roots. Once the  decompression was complete, I turned my attention to the posterior lower lumbar interbody fusion. The  epidural venous vasculature was coagulated and cut sharply. Disc space was incised and the initial discectomy was performed with pituitary rongeurs. The disc space was distracted with sequential distractors to a height of 11 millimeters. We then used a series of scrapers and shavers to prepare the endplates for fusion. The midline was prepared with Epstein curettes. Once the complete discectomy was finished, we packed an appropriate sized interbody cage with local autograft and morcellized allograft, gently retracted the nerve root, and tapped the cage into position at L4-5.  The midline between the cages was packed with morselized autograft and allograft. We then turned our attention to the placement of the lower pedicle screws. The pedicle screw entry zones were identified utilizing surface landmarks and fluoroscopy. I drilled into each pedicle utilizing the hand drill, and tapped each pedicle with the appropriate tap. We palpated with a ball probe to assure no break in the cortex. We then placed 6.5 x 40 mm pedicle screws into the pedicles bilaterally at L5. We then decorticated the transverse processes and laid a mixture of morcellized autograft and allograft out over these to perform intertransverse arthrodesis at L4-5. We then placed lordotic rods into the multiaxial screw heads of the pedicle screws and locked these in position with the locking caps and anti-torque device. We then checked our construct with AP and lateral fluoroscopy. Irrigated with copious amounts of bacitracin-containing saline solution. Inspected the nerve roots once again to assure adequate decompression, lined to the dura with Gelfoam, placed powdered vancomycin into the wound, and closed the muscle and the fascia with 0 Vicryl. Closed the subcutaneous tissues with 2-0 Vicryl and subcuticular tissues with 3-0 Vicryl. The skin was closed with benzoin and Steri-Strips. Dressing was then applied, the patient was awakened from general  anesthesia and transported to the recovery room in stable condition. At the end of the procedure all sponge, needle and instrument counts were correct.   PLAN OF CARE: admit to inpatient  PATIENT DISPOSITION:  PACU - hemodynamically stable.   Delay start of Pharmacological VTE agent (>24hrs) due to surgical blood loss or risk of bleeding:  yes

## 2018-08-29 NOTE — Anesthesia Postprocedure Evaluation (Signed)
Anesthesia Post Note  Patient: DOUGAL PAOLILLO  Procedure(s) Performed: Posterior Lumbar Interbody Fusion lumbar Four-Five - Posterior Lateral and Interbody fusion (N/A Spine Lumbar)     Patient location during evaluation: PACU Anesthesia Type: General Level of consciousness: awake and alert Pain management: pain level controlled Vital Signs Assessment: post-procedure vital signs reviewed and stable Respiratory status: spontaneous breathing, nonlabored ventilation, respiratory function stable and patient connected to nasal cannula oxygen Cardiovascular status: blood pressure returned to baseline and stable Postop Assessment: no apparent nausea or vomiting Anesthetic complications: no    Last Vitals:  Vitals:   08/29/18 1338 08/29/18 1556  BP: 134/75 (!) 153/76  Pulse: 63 75  Resp: 16 18  Temp: (!) 36.3 C (!) 36.3 C  SpO2: 91% 97%    Last Pain:  Vitals:   08/29/18 1556  TempSrc: Oral  PainSc:                  Amatullah Christy DAVID

## 2018-08-29 NOTE — Progress Notes (Signed)
Orthopedic Tech Progress Note Patient Details:  Joe Ramos 1945-12-18 [id number] Patient has brace. Was already ordered Patient ID: Joe Ramos, male   DOB: 07-14-1946, 73 y.o.   MRN: [id number]   Donald Pore 08/29/2018, 2:14 PM

## 2018-08-29 NOTE — H&P (Signed)
Subjective: Patient is a 73 y.o. male admitted for plif. Onset of symptoms was several months ago, gradually worsening since that time.  The pain is rated severe, and is located at the across the lower back and radiates to legs. The pain is described as aching and occurs all day. The symptoms have been progressive. Symptoms are exacerbated by exercise. MRI or CT showed spondylolisthesis with stenosis l4-5   Past Medical History:  Diagnosis Date  . Arthritis   . Hypertension   . Sleep apnea   . Umbilical hernia     Past Surgical History:  Procedure Laterality Date  . eye lid surgery Bilateral   . EYE SURGERY     as a child  . 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 REPLACEMENT TOTAL KNEE Left 2018    Prior to Admission medications   Medication Sig Start Date End Date Taking? Authorizing Provider  aspirin 325 MG tablet Take 325 mg by mouth daily.   Yes [provider]  Cholecalciferol (VITAMIN D3) 125 MCG (5000 UT) CAPS Take 1 capsule by mouth daily.   Yes [provider]  Coenzyme Q10 (COQ10) 100 MG CAPS Take 1 capsule by mouth daily.   Yes [provider]  ezetimibe-simvastatin (VYTORIN) 10-20 MG tablet Take 1 tablet by mouth daily.   Yes [provider]  Glucosamine-MSM-Hyaluronic Acd (JOINT HEALTH PO) Take 1 tablet by mouth daily.   Yes [provider]  Iron-Vitamins (GERITOL COMPLETE) TABS Take 1 tablet by mouth daily.   Yes [provider]  losartan-hydrochlorothiazide (HYZAAR) 50-12.5 MG tablet Take 1 tablet by mouth daily.   Yes [provider]  Multiple Vitamins-Minerals (PRESERVISION AREDS 2 PO) Take 1 capsule by mouth daily.   Yes [provider]  OVER THE COUNTER MEDICATION Take 1 capsule by mouth daily. Protandim  is a mixture of 5 herbal supplements (milk thistle, bacopa extract, ashwagandha, green tea extract, and turmeric extract) intended to upregulate the body's own production of antioxidants.    Yes [provider]  Propylene Glycol-Glycerin (EQ ARTIFICIAL TEARS) 1-0.3 % SOLN Apply 1 drop to eye 3 (three) times daily as needed (dry eyes).   Yes [provider]  vitamin B-12 (CYANOCOBALAMIN) 500 MCG tablet Take 500 mcg by mouth daily.   Yes [provider]   Allergies  Allergen Reactions  . Codeine Nausea And Vomiting  . Tramadol Nausea And Vomiting    Social History   Tobacco Use  . Smoking status: Never Smoker  . Smokeless tobacco: Never Used  Substance Use Topics  . Alcohol use: Yes    Alcohol/week: 1.0 standard drinks    Types: 1 Shots of liquor per week    History reviewed. No pertinent family history.   Review of Systems  Positive ROS: neg  All other systems have been reviewed and were otherwise negative with the exception of those mentioned in the HPI and as above.  Objective: Vital signs in last 24 hours: Temp:  [97.7 F (36.5 C)] 97.7 F (36.5 C) (01/08 0657) Pulse Rate:  [61] 61 (01/08 0657) Resp:  [18] 18 (01/08 0657) BP: (163)/(74) 163/74 (01/08 0657) SpO2:  [100 %] 100 % (01/08 0657)  General Appearance: Alert, cooperative, no distress, appears stated age Head: Normocephalic, without obvious abnormality, atraumatic Eyes: PERRL, conjunctiva/corneas clear, EOM's intact    Neck: Supple, symmetrical, trachea midline Back: Symmetric, no curvature, ROM normal, no CVA tenderness Lungs:  respirations unlabored Heart: Regular rate and rhythm Abdomen:  Soft, non-tender Extremities: Extremities normal, atraumatic, no cyanosis or edema Pulses: 2+ and symmetric all extremities Skin: Skin color, texture, turgor normal, no rashes or lesions  NEUROLOGIC:   Mental status: Alert and oriented x4,  no aphasia, good attention span, fund of knowledge, and memory Motor Exam - grossly normal Sensory Exam - grossly normal Reflexes: 1+ Coordination - grossly normal Gait - grossly normal Balance - grossly normal Cranial Nerves: I: smell  Not tested  II: visual acuity  OS: nl    OD: nl  II: visual fields Full to confrontation  II: pupils Equal, round, reactive to light  III,VII: ptosis None  III,IV,VI: extraocular muscles  Full ROM  V: mastication Normal  V: facial light touch sensation  Normal  V,VII: corneal reflex  Present  VII: facial muscle function - upper  Normal  VII: facial muscle function - lower Normal  VIII: hearing Not tested  IX: soft palate elevation  Normal  IX,X: gag reflex Present  XI: trapezius strength  5/5  XI: sternocleidomastoid strength 5/5  XI: neck flexion strength  5/5  XII: tongue strength  Normal    Data Review Lab Results  Component Value Date   WBC 6.5 08/24/2018   HGB 13.9 08/24/2018   HCT 42.3 08/24/2018   MCV 92.4 08/24/2018   PLT 185 08/24/2018   No results found for: NA, K, CL, CO2, BUN, CREATININE, GLUCOSE Lab Results  Component Value Date   INR 1.16 08/24/2018    Assessment/Plan:  Estimated body mass index is 31.44 kg/m as calculated from the following:   Height as of 08/24/18: 6' (1.829 m).   Weight as of 08/24/18: 105.1 kg. Patient admitted for PLIF L4-5. Patient has failed a reasonable attempt at conservative therapy.  I explained the condition and procedure to the patient and answered any questions.  Patient wishes to proceed with procedure as planned. Understands risks/ benefits and typical outcomes of procedure.   Tia AlertDavid S Marketta Valadez 08/29/2018 8:16 AM

## 2018-08-30 DIAGNOSIS — M4316 Spondylolisthesis, lumbar region: Secondary | ICD-10-CM | POA: Diagnosis not present

## 2018-08-30 MED ORDER — METHOCARBAMOL 500 MG PO TABS: 500.0000 mg | ORAL_TABLET | Freq: Four times a day (QID) | ORAL | 2 refills | Status: DC | PRN

## 2018-08-30 MED ORDER — OXYCODONE HCL 5 MG PO TABS: 5.0000 mg | ORAL_TABLET | Freq: Four times a day (QID) | ORAL | 0 refills | Status: DC | PRN

## 2018-08-30 MED ORDER — CELECOXIB 200 MG PO CAPS: 200.0000 mg | ORAL_CAPSULE | Freq: Two times a day (BID) | ORAL | 0 refills | Status: DC | PRN

## 2018-08-30 NOTE — Evaluation (Signed)
Occupational Therapy Evaluation and Discharge Patient Details Name: Joe Ramos MRN: [id number] DOB: Feb 11, 1946 Today's Date: 08/30/2018    History of Present Illness s/p L4-5 PLIF   Clinical Impression   All education completed. Pt verbalizing and/or demonstrating understanding. Pt functioning at a modified independent level. No further OT needs.    Follow Up Recommendations  No OT follow up    Equipment Recommendations  None recommended by OT    Recommendations for Other Services       Precautions / Restrictions Precautions Precautions: Back Precaution Booklet Issued: Yes (comment) Precaution Comments: reviewed back precautions verbally Required Braces or Orthoses: Spinal Brace Spinal Brace: Lumbar corset;Applied in sitting position Restrictions Weight Bearing Restrictions: No      Mobility Bed Mobility Overal bed mobility: Modified Independent             General bed mobility comments: instructed in log roll technique  Transfers Overall transfer level: Modified independent               General transfer comment: increased time, no physical assist    Balance                                           ADL either performed or assessed with clinical judgement   ADL Overall ADL's : Modified independent                                       General ADL Comments: Educated pt and family extensively in back precautions related to ADL and IADL.     Vision Baseline Vision/History: Wears glasses Wears Glasses: At all times Patient Visual Report: No change from baseline       Perception     Praxis      Pertinent Vitals/Pain Pain Assessment: Faces Faces Pain Scale: Hurts a little bit Pain Location: incision Pain Descriptors / Indicators: Operative site guarding Pain Intervention(s): Monitored during session     Hand Dominance Right   Extremity/Trunk Assessment Upper Extremity Assessment Upper Extremity  Assessment: Overall WFL for tasks assessed   Lower Extremity Assessment Lower Extremity Assessment: Defer to PT evaluation       Communication Communication Communication: No difficulties   Cognition Arousal/Alertness: Awake/alert Behavior During Therapy: WFL for tasks assessed/performed Overall Cognitive Status: Within Functional Limits for tasks assessed                                     General Comments       Exercises     Shoulder Instructions      Home Living Family/patient expects to be discharged to:: Private residence Living Arrangements: Spouse/significant other Available Help at Discharge: Family;Available 24 hours/day Type of Home: House Home Access: Stairs to enter Enter[employer] CorporationEntrance Stairs-Number of Steps: 6 Entrance Stairs-Rails: Right Home Layout: Two level;Bed/bath upstairs Alternate Level Stairs-Number of Steps: flight Alternate Level Stairs-Rails: Right Bathroom Shower/Tub: Chief Strategy OfficerTub/shower unit   Bathroom Toilet: Standard     Home Equipment: Grab bars - tub/shower;Toilet riser;Walker - 2 wheels          Prior Functioning/Environment Level of Independence: Independent        Comments: likes to golf        OT Problem  List:        OT Treatment/Interventions:      OT Goals(Current goals can be found in the care plan section) Acute Rehab OT Goals Patient Stated Goal: to return to golfing  OT Frequency:     Barriers to D/C:            Co-evaluation              AM-PAC OT "6 Clicks" Daily Activity     Outcome Measure Help from another person eating meals?: None Help from another person taking care of personal grooming?: None Help from another person toileting, which includes using toliet, bedpan, or urinal?: None Help from another person bathing (including washing, rinsing, drying)?: None Help from another person to put on and taking off regular upper body clothing?: None Help from another person to put on and taking off  regular lower body clothing?: None 6 Click Score: 24   End of Session Equipment Utilized During Treatment: Back brace  Activity Tolerance: Patient tolerated treatment well Patient left: in bed;with call bell/phone within reach;with family/visitor present  OT Visit Diagnosis: Pain                Time: 4656-8127 OT Time Calculation (min): 43 min Charges:  OT General Charges $OT Visit: 1 Visit OT Evaluation $OT Eval Low Complexity: 1 Low OT Treatments $Self Care/Home Management : 23-37 mins  Martie Round, OTR/L Acute Rehabilitation Services Pager: [phone] Office: [phone]  Evern Bio 08/30/2018, 9:29 AM

## 2018-08-30 NOTE — Discharge Summary (Signed)
Physician Discharge Summary  Patient ID: Joe Ramos MRN: [id number] DOB/AGE: [birth year] 73 y.o.  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 The patient tolerated the procedure well and was taken to the recovery room and then to the floor in stable condition. The hospital course was routine. There were no complications. The wound remained clean dry and intact. Pt had appropriate back soreness. No complaints of leg pain or new N/T/W. The patient remained afebrile with stable vital signs, and tolerated a regular diet. The patient continued to increase activities, and pain was well controlled with oral pain medications.   Consults: None  Significant Diagnostic Studies:  Results for orders placed or performed during the hospital encounter of 08/24/18  Surgical pcr screen  Result Value Ref Range   MRSA, PCR NEGATIVE NEGATIVE   Staphylococcus aureus NEGATIVE NEGATIVE  CBC WITH DIFFERENTIAL  Result Value Ref Range   WBC 6.5 4.0 - 10.5 K/uL   RBC 4.58 4.22 - 5.81 MIL/uL   Hemoglobin 13.9 13.0 - 17.0 g/dL   HCT 40.942.3 81.139.0 - 91.452.0 %   MCV 92.4 80.0 - 100.0 fL   MCH 30.3 26.0 - 34.0 pg   MCHC 32.9 30.0 - 36.0 g/dL   RDW 78.212.6 95.611.5 - 21.315.5 %   Platelets 185 150 - 400 K/uL   nRBC 0.0 0.0 - 0.2 %   Neutrophils Relative % 64 %   Neutro Abs 4.2 1.7 - 7.7 K/uL   Lymphocytes Relative 21 %   Lymphs Abs 1.4 0.7 - 4.0 K/uL   Monocytes Relative 11 %   Monocytes Absolute 0.7 0.1 - 1.0 K/uL   Eosinophils Relative 3 %   Eosinophils Absolute 0.2 0.0 - 0.5 K/uL   Basophils Relative 1 %   Basophils Absolute 0.0 0.0 - 0.1 K/uL   Immature Granulocytes 0 %   Abs Immature Granulocytes 0.02 0.00 - 0.07 K/uL  Protime-INR  Result Value Ref Range    Prothrombin Time 14.7 11.4 - 15.2 seconds   INR 1.16   Type and screen All Cardiac and thoracic surgeries, spinal fusions, myomectomies, craniotomies, colon & liver resections, total joint revisions, same day c-section with placenta previa or accreta.  Result Value Ref Range   ABO/RH(D) A POS    Antibody Screen NEG    Sample Expiration 09/07/2018    Extend sample reason      NO TRANSFUSIONS OR PREGNANCY IN THE PAST 3 MONTHS Performed at [facility], [address] [place], [place] [address]   ABO/Rh  Result Value Ref Range   ABO/RH(D)      A POS Performed at [facility], [address] [place], [place] [address]     Chest 2 View  Result Date: 08/24/2018 CLINICAL DATA:  Preop back surgery sleep apnea EXAM: CHEST - 2 VIEW COMPARISON:  None. FINDINGS: The heart size and mediastinal contours are within normal limits. Both lungs are clear. Chronic appearing mild wedging deformities at the midthoracic spine. IMPRESSION: No active cardiopulmonary disease. Electronically Signed   By: Jasmine PangKim  Fujinaga M.D.   On: 08/24/2018 17:08   Dg Lumbar Spine 2-3 Views  Result Date: 08/29/2018 CLINICAL DATA:  L4-5 posterior fusion EXAM: LUMBAR SPINE - 2-3 VIEW; DG C-ARM 61-120 MIN COMPARISON:  None. FINDINGS: Two  intraoperative spot images demonstrate changes of posterior fusion at L4-5. Normal alignment. No hardware or bony complicating feature. IMPRESSION: Posterior fusion L4-5.  No visible complicating feature. Electronically Signed   By: Charlett Nose M.D.   On: 08/29/2018 11:28   Dg C-arm 1-60 Min  Result Date: 08/29/2018 CLINICAL DATA:  L4-5 posterior fusion EXAM: LUMBAR SPINE - 2-3 VIEW; DG C-ARM 61-120 MIN COMPARISON:  None. FINDINGS: Two intraoperative spot images demonstrate changes of posterior fusion at L4-5. Normal alignment. No hardware or bony complicating feature. IMPRESSION: Posterior fusion L4-5.  No visible complicating feature. Electronically Signed   By: Charlett Nose M.D.   On:  08/29/2018 11:28    Antibiotics:  Anti-infectives (From admission, onward)   Start     Dose/Rate Route Frequency Ordered Stop   08/29/18 1400  ceFAZolin (ANCEF) IVPB 2g/100 mL premix     2 g 200 mL/hr over 30 Minutes Intravenous Every 8 hours 08/29/18 1348 08/30/18 0744   08/29/18 0945  vancomycin (VANCOCIN) powder  Status:  Discontinued       As needed 08/29/18 0946 08/29/18 1126   08/29/18 0939  bacitracin 50,000 Units in sodium chloride 0.9 % 500 mL irrigation  Status:  Discontinued       As needed 08/29/18 0939 08/29/18 1126   08/29/18 0645  ceFAZolin (ANCEF) IVPB 2g/100 mL premix     2 g 200 mL/hr over 30 Minutes Intravenous On call to O.R. 08/29/18 7939 08/29/18 0855      Discharge Exam: Blood pressure (!) 137/57, pulse 65, temperature 98 F (36.7 C), temperature source Oral, resp. rate 20, height 6' (1.829 m), weight 105.1 kg, SpO2 96 %. Neurologic: Grossly normal Dressing dry  Discharge Medications:   Allergies as of 08/30/2018      Reactions   Codeine Nausea And Vomiting   Tramadol Nausea And Vomiting      Medication List    TAKE these medications   aspirin 325 MG tablet Take 325 mg by mouth daily.   celecoxib 200 MG capsule Commonly known as:  CELEBREX Take 1 capsule (200 mg total) by mouth 2 (two) times daily as needed for moderate pain.   CoQ10 100 MG Caps Take 1 capsule by mouth daily.   EQ ARTIFICIAL TEARS 1-0.3 % Soln Generic drug:  Propylene Glycol-Glycerin Apply 1 drop to eye 3 (three) times daily as needed (dry eyes).   ezetimibe-simvastatin 10-20 MG tablet Commonly known as:  VYTORIN Take 1 tablet by mouth daily.   GERITOL COMPLETE Tabs Take 1 tablet by mouth daily.   JOINT HEALTH PO Take 1 tablet by mouth daily.   losartan-hydrochlorothiazide 50-12.5 MG tablet Commonly known as:  HYZAAR Take 1 tablet by mouth daily.   methocarbamol 500 MG tablet Commonly known as:  ROBAXIN Take 1 tablet (500 mg total) by mouth every 6 (six) hours as  needed for muscle spasms.   OVER THE COUNTER MEDICATION Take 1 capsule by mouth daily. Protandim  is a mixture of 5 herbal supplements (milk thistle, bacopa extract, ashwagandha, green tea extract, and turmeric extract) intended to upregulate the body's own production of antioxidants.   oxyCODONE 5 MG immediate release tablet Commonly known as:  Oxy IR/ROXICODONE Take 1 tablet (5 mg total) by mouth every 6 (six) hours as needed for moderate pain ((score 4 to 6)).   PRESERVISION AREDS 2 PO Take 1 capsule by mouth daily.   vitamin B-12 500 MCG tablet Commonly known as:  CYANOCOBALAMIN Take 500 mcg by mouth daily.  Vitamin D3 125 MCG (5000 UT) Caps Take 1 capsule by mouth daily.            Durable Medical Equipment  (From admission, onward)         Start     Ordered   08/29/18 1349  DME Walker rolling  Once    Question:  Patient needs a walker to treat with the following condition  Answer:  S/P lumbar fusion   08/29/18 1348   08/29/18 1349  DME 3 n 1  Once     08/29/18 1348          Disposition: home   Final Dx: PLIF L4-5  Discharge Instructions     Remove dressing in 72 hours   Complete by:  As directed    Call MD for:  difficulty breathing, headache or visual disturbances   Complete by:  As directed    Call MD for:  persistant nausea and vomiting   Complete by:  As directed    Call MD for:  redness, tenderness, or signs of infection (pain, swelling, redness, odor or green/yellow discharge around incision site)   Complete by:  As directed    Call MD for:  severe uncontrolled pain   Complete by:  As directed    Call MD for:  temperature >100.4   Complete by:  As directed    Diet - low sodium heart healthy   Complete by:  As directed    Increase activity slowly   Complete by:  As directed          Signed: Tia Alert 08/30/2018, 7:53 AM

## 2018-08-30 NOTE — Discharge Instructions (Signed)

## 2018-08-30 NOTE — Evaluation (Signed)
Physical Therapy Evaluation and Discharge Patient Details Name: Joe Ramos MRN: [id number] DOB: 08/31/1945 Today's Date: 08/30/2018   History of Present Illness  Pt is a 73 y/o male who presents s/p L4-5 PLIF on 08/29/2018.  Clinical Impression  Patient evaluated by Physical Therapy with no further acute PT needs identified. All education has been completed and the patient has no further questions. At the time of PT eval pt was able to perform transfers and ambulation with gross modified independence and no AD. Observed pt ambulating around unit several times before PT session. Focus of session was stair training and education on car transfer, positioning recommendations, brace application/wearing schedule, and general maintenance of precautions with functional mobility. See below for any follow-up Physical Therapy or equipment needs. PT is signing off. Thank you for this referral.     Follow Up Recommendations No PT follow up;Supervision - Intermittent    Equipment Recommendations  None recommended by PT    Recommendations for Other Services       Precautions / Restrictions Precautions Precautions: Back Precaution Booklet Issued: Yes (comment) Precaution Comments: reviewed back precautions verbally Required Braces or Orthoses: Spinal Brace Spinal Brace: Lumbar corset;Applied in sitting position Restrictions Weight Bearing Restrictions: No      Mobility  Bed Mobility Overal bed mobility: Modified Independent             General bed mobility comments: Reviewed log roll technique  Transfers Overall transfer level: Modified independent Equipment used: None             General transfer comment: increased time, no physical assist. VC's for improved posture during stand>sit.   Ambulation/Gait Ambulation/Gait assistance: Modified independent (Device/Increase time) Gait Distance (Feet): 500 Feet Assistive device: None Gait Pattern/deviations: Step-through  pattern;Decreased stride length Gait velocity: Decreased slightly Gait velocity interpretation: >2.62 ft/sec, indicative of community ambulatory General Gait Details: Pt ambulating well without unsteadiness or LOB. VC's for maintenance of precautions.   Stairs Stairs: Yes Stairs assistance: Modified independent (Device/Increase time) Stair Management: One rail Right;Step to pattern;Forwards Number of Stairs: 10 General stair comments: VC's for sequencing and general safety. No assist required and pt completed without difficulty.   Wheelchair Mobility    Modified Rankin (Stroke Patients Only)       Balance Overall balance assessment: Modified Independent                                           Pertinent Vitals/Pain Pain Assessment: Faces Faces Pain Scale: No hurt Pain Location: incision Pain Descriptors / Indicators: Operative site guarding Pain Intervention(s): Monitored during session    Home Living Family/patient expects to be discharged to:: Private residence Living Arrangements: Spouse/significant other Available Help at Discharge: Family;Available 24 hours/day Type of Home: House Home Access: Stairs to enter Entrance Stairs-Rails: Right Entrance Stairs-Number of Steps: 6 Home Layout: Two level;Bed/bath upstairs Home Equipment: Grab bars - tub/shower;Toilet riser;Walker - 2 wheels      Prior Function Level of Independence: Independent         Comments: likes to golf     Hand Dominance   Dominant Hand: Right    Extremity/Trunk Assessment   Upper Extremity Assessment Upper Extremity Assessment: Overall WFL for tasks assessed    Lower Extremity Assessment Lower Extremity Assessment: Overall WFL for tasks assessed    Cervical / Trunk Assessment Cervical / Trunk Assessment: Other exceptions Cervical /  Trunk Exceptions: s/p surgery  Communication   Communication: No difficulties  Cognition Arousal/Alertness:  Awake/alert Behavior During Therapy: WFL for tasks assessed/performed Overall Cognitive Status: Within Functional Limits for tasks assessed                                        General Comments      Exercises     Assessment/Plan    PT Assessment Patent does not need any further PT services  PT Problem List         PT Treatment Interventions      PT Goals (Current goals can be found in the Care Plan section)  Acute Rehab PT Goals Patient Stated Goal: to return to golfing PT Goal Formulation: All assessment and education complete, DC therapy    Frequency     Barriers to discharge        Co-evaluation               AM-PAC PT "6 Clicks" Mobility  Outcome Measure Help needed turning from your back to your side while in a flat bed without using bedrails?: None Help needed moving from lying on your back to sitting on the side of a flat bed without using bedrails?: None Help needed moving to and from a bed to a chair (including a wheelchair)?: None Help needed standing up from a chair using your arms (e.g., wheelchair or bedside chair)?: None Help needed to walk in hospital room?: None Help needed climbing 3-5 steps with a railing? : None 6 Click Score: 24    End of Session Equipment Utilized During Treatment: Back brace Activity Tolerance: Patient tolerated treatment well Patient left: in chair;with call bell/phone within reach;with family/visitor present Nurse Communication: Mobility status PT Visit Diagnosis: Pain;Other symptoms and signs involving the nervous system (R29.898) Pain - part of body: (back)    Time: 7673-4193 PT Time Calculation (min) (ACUTE ONLY): 16 min   Charges:   PT Evaluation $PT Eval Low Complexity: 1 Low          Conni Slipper, PT, DPT Acute Rehabilitation Services Pager: [phone] Office: [phone]   Marylynn Pearson 08/30/2018, 10:48 AM

## 2018-08-30 NOTE — Progress Notes (Signed)
Patient is discharged from room 3C02 at this time. Alert and in stable condition. IV site d/c'd and instructions read to patient and family with understanding verbalized. Left unit via wheelchair with all belongings at side. 

## 2020-01-18 IMAGING — CR DG CHEST 2V
2 series · 2 of 2 positions shown · non-contrast
Comparison: None.

CLINICAL DATA: Preop back surgery sleep apnea

EXAM:
CHEST - 2 VIEW

[w chest pa]
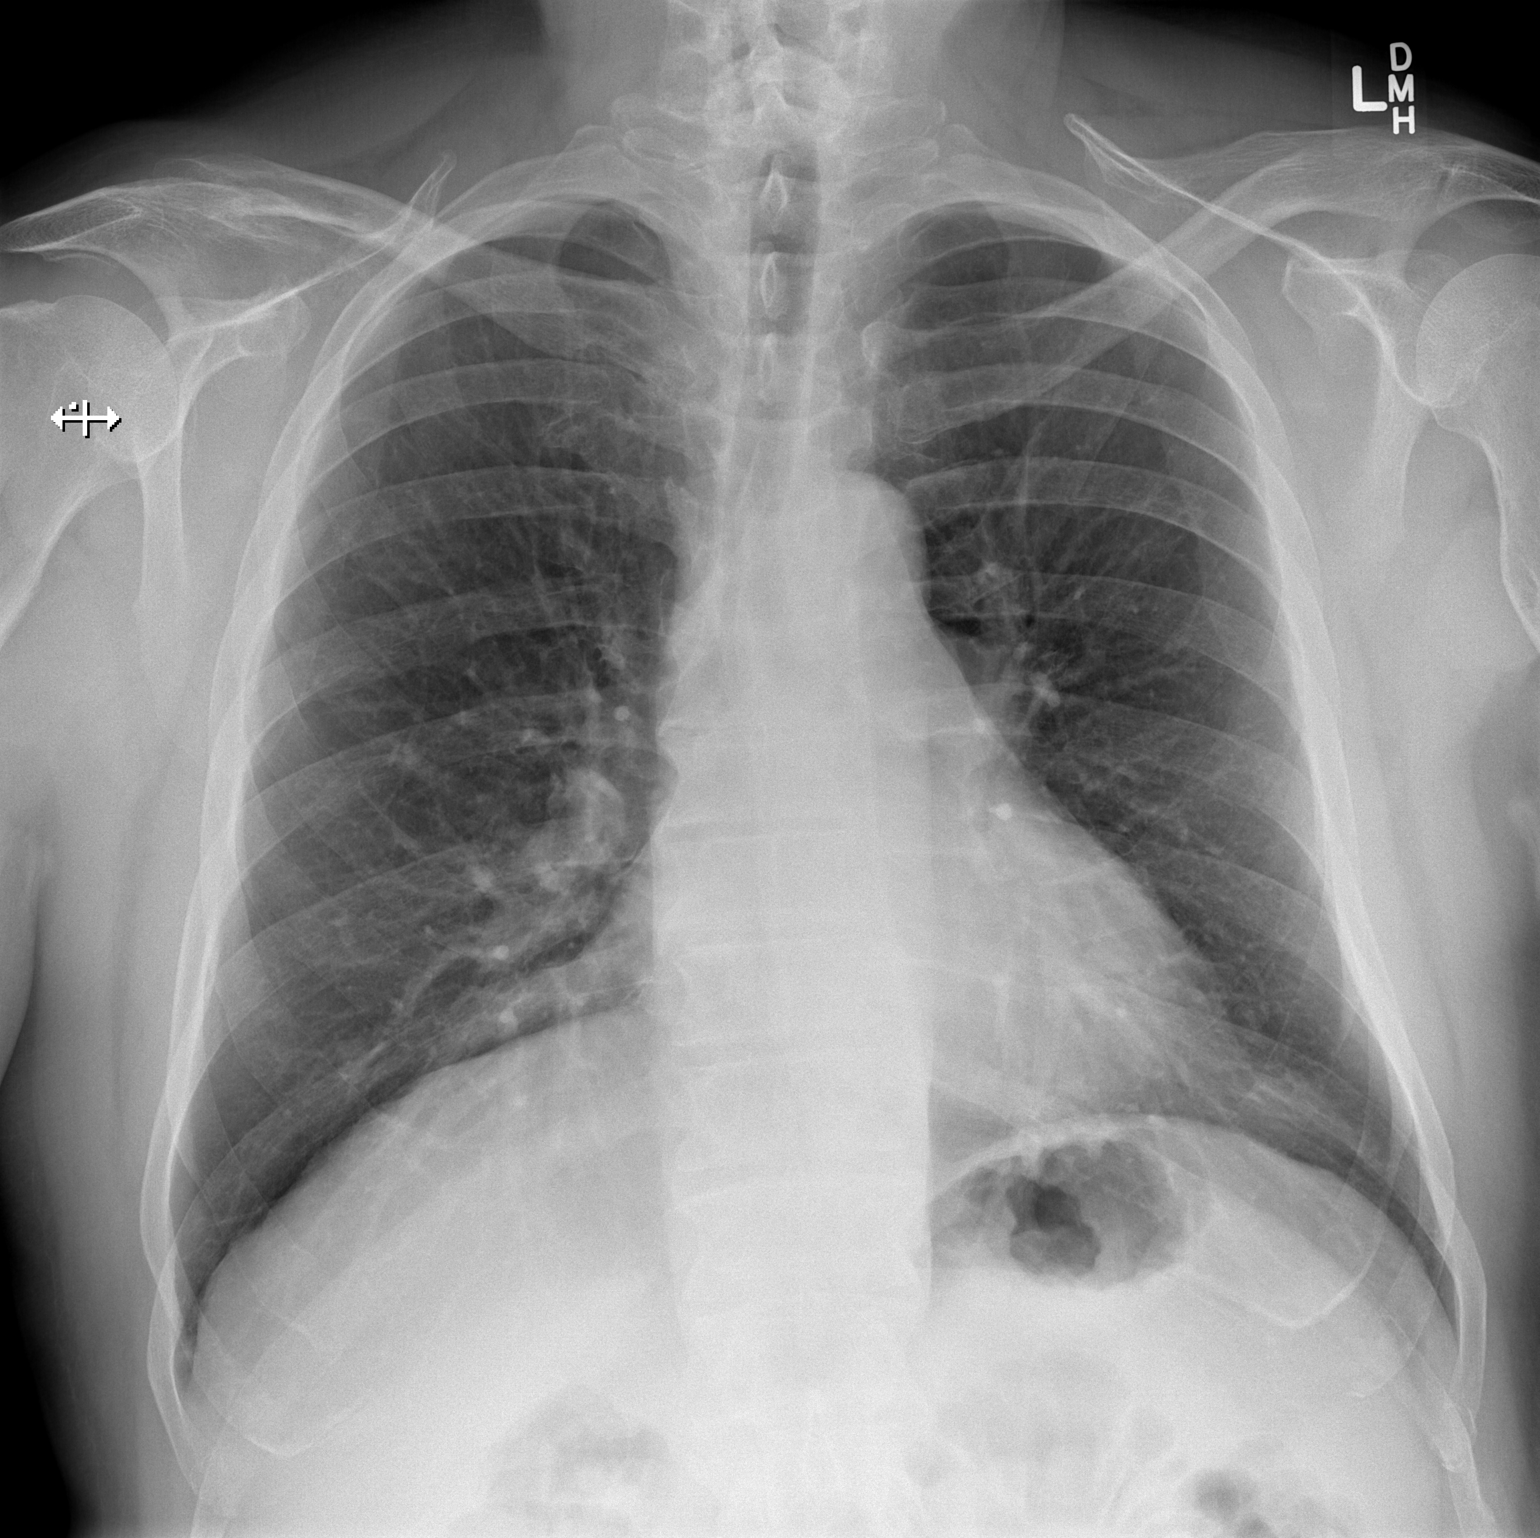

[w chest lat]
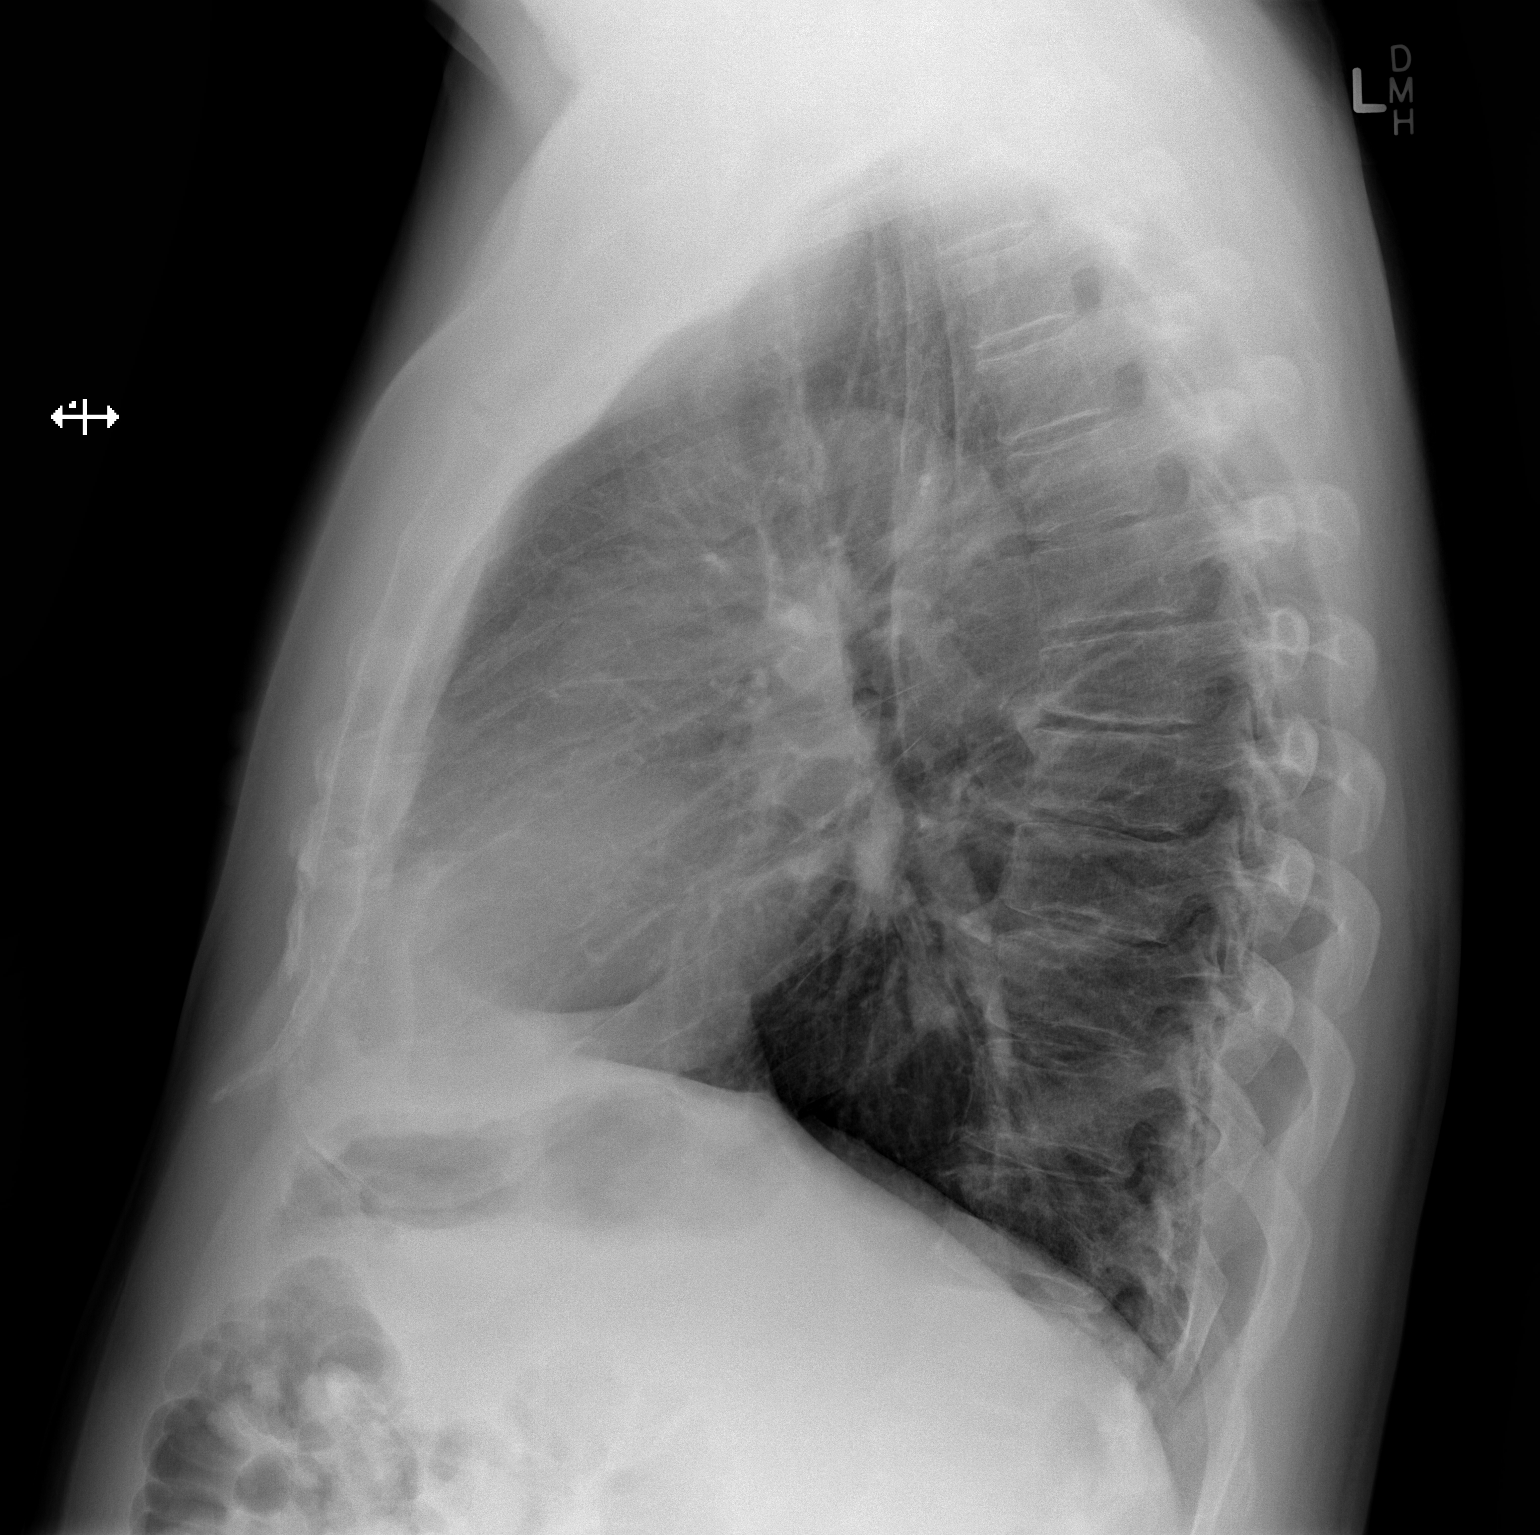

[2 of 2 positions shown; findings below may reference images not displayed]

FINDINGS: The heart size and mediastinal contours are within normal limits.
Both lungs are clear. Chronic appearing mild wedging deformities at
the midthoracic spine.
IMPRESSION: No active cardiopulmonary disease.

## 2021-12-08 ENCOUNTER — Other Ambulatory Visit: Payer: Self-pay | Admitting: Neurological Surgery

## 2021-12-21 NOTE — Progress Notes (Signed)
Surgical Instructions ? ? ? Your procedure is scheduled on 12/29/21. ? Report to [facility] Main Entrance "A" at 6:30 A.M., then check in with the Admitting office. ? Call this number if you have problems the morning of surgery: ? [phone] ? ? If you have any questions prior to your surgery date call [phone]: Open Monday-Friday 8am-4pm ? ? ? Remember: ? Do not eat or drink after midnight the night before your surgery ? ? Take these medicines the morning of surgery with A SIP OF WATER:  ?Propylene Glycol-Glycerin eye drops if needed ? ?As of today, STOP taking any Aspirin (unless otherwise instructed by your surgeon) Aleve, Naproxen, Ibuprofen, Motrin, Advil, Goody's, BC's, all herbal medications, fish oil, and all vitamins. ? ?         ?Do not wear jewelry or makeup ?Do not wear lotions, powders, perfumes/colognes, or deodorant. ?Men may shave face and neck. ?Do not bring valuables to the hospital. ?Do not wear nail polish, gel polish, artificial nails, or any other type of covering on natural nails (fingers and toes) ?If you have artificial nails or gel coating that need to be removed by a nail salon, please have this removed prior to surgery. Artificial nails or gel coating may interfere with anesthesia's ability to adequately monitor your vital signs. ? ?[facility] is not responsible for any belongings or valuables. .  ? ?Do NOT Smoke (Tobacco/Vaping)  24 hours prior to your procedure ? ?If you use a CPAP at night, you may bring your mask for your overnight stay. ?  ?Contacts, glasses, hearing aids, dentures or partials may not be worn into surgery, please bring cases for these belongings ?  ?For patients admitted to the hospital, discharge time will be determined by your treatment team. ?  ?Patients discharged the day of surgery will not be allowed to drive home, and someone needs to stay with them for 24 hours. ? ? ?SURGICAL WAITING ROOM VISITATION ?Patients having surgery or a procedure in a hospital  may have two support people. ?Children under the age of 47 must have an adult with them who is not the patient. ?They may stay in the waiting area during the procedure and may switch out with other visitors. If the patient needs to stay at the hospital during part of their recovery, the visitor guidelines for inpatient rooms apply. ? ?Please refer to the [facility] website for the visitor guidelines for Inpatients (after your surgery is over and you are in a regular room).  ? ? ? ? ? ?Special instructions:   ? ?Oral Hygiene is also important to reduce your risk of infection.  Remember - BRUSH YOUR TEETH THE MORNING OF SURGERY WITH YOUR REGULAR TOOTHPASTE ? ? ?[facility]- Preparing For Surgery ? ?Before surgery, you can play an important role. Because skin is not sterile, your skin needs to be as free of germs as possible. You can reduce the number of germs on your skin by washing with CHG (chlorahexidine gluconate) Soap before surgery.  CHG is an antiseptic cleaner which kills germs and bonds with the skin to continue killing germs even after washing.   ? ? ?Please do not use if you have an allergy to CHG or antibacterial soaps. If your skin becomes reddened/irritated stop using the CHG.  ?Do not shave (including legs and underarms) for at least 48 hours prior to first CHG shower. It is OK to shave your face. ? ?Please follow these instructions carefully. ?  ? ?  Shower the NIGHT BEFORE SURGERY and the MORNING OF SURGERY with CHG Soap.  ? If you chose to wash your hair, wash your hair first as usual with your normal shampoo. After you shampoo, rinse your hair and body thoroughly to remove the shampoo.  Then [employer] and genitals (private parts) with your normal soap and rinse thoroughly to remove soap. ? ?After that Use CHG Soap as you would any other liquid soap. You can apply CHG directly to the skin and wash gently with a scrungie or a clean washcloth.  ? ?Apply the CHG Soap to your body ONLY FROM THE NECK DOWN.   Do not use on open wounds or open sores. Avoid contact with your eyes, ears, mouth and genitals (private parts). Wash Face and genitals (private parts)  with your normal soap.  ? ?Wash thoroughly, paying special attention to the area where your surgery will be performed. ? ?Thoroughly rinse your body with warm water from the neck down. ? ?DO NOT shower/wash with your normal soap after using and rinsing off the CHG Soap. ? ?Pat yourself dry with a CLEAN TOWEL. ? ?Wear CLEAN PAJAMAS to bed the night before surgery ? ?Place CLEAN SHEETS on your bed the night before your surgery ? ?DO NOT SLEEP WITH PETS. ? ? ?Day of Surgery: ?Take a shower with CHG soap. ?Wear Clean/Comfortable clothing the morning of surgery ?Do not apply any deodorants/lotions.   ?Remember to brush your teeth WITH YOUR REGULAR TOOTHPASTE. ? ? ? ?If you received a COVID test during your pre-op visit, it is requested that you wear a mask when out in public, stay away from anyone that may not be feeling well, and notify your surgeon if you develop symptoms. If you have been in contact with anyone that has tested positive in the last 10 days, please notify your surgeon. ? ?  ?Please read over the following fact sheets that you were given.  ? ?

## 2021-12-22 ENCOUNTER — Other Ambulatory Visit: Payer: Self-pay

## 2021-12-22 ENCOUNTER — Encounter (HOSPITAL_COMMUNITY)
Admission: RE | Admit: 2021-12-22 | Discharge: 2021-12-22 | Disposition: A | Discharge status: Home / Self Care | Payer: Medicare Other | Source: Ambulatory Visit | Attending: Neurological Surgery | Admitting: Neurological Surgery

## 2021-12-22 ENCOUNTER — Encounter (HOSPITAL_COMMUNITY): Payer: Self-pay

## 2021-12-22 VITALS — BP 138/72 | HR 66 | Temp 98.2°F | Resp 18 | Ht 72.0 in | Wt 230.0 lb

## 2021-12-22 DIAGNOSIS — Z01818 Encounter for other preprocedural examination: Secondary | ICD-10-CM | POA: Diagnosis present

## 2021-12-22 LAB — TYPE AND SCREEN
ABO/RH(D): A POS
Antibody Screen: NEGATIVE

## 2021-12-22 LAB — CBC
HCT: 41.9 % (ref 39.0–52.0)
Hemoglobin: 14.6 g/dL (ref 13.0–17.0)
MCH: 32.2 pg (ref 26.0–34.0)
MCHC: 34.8 g/dL (ref 30.0–36.0)
MCV: 92.3 fL (ref 80.0–100.0)
Platelets: 177 10*3/uL (ref 150–400)
RBC: 4.54 MIL/uL (ref 4.22–5.81)
RDW: 12.1 % (ref 11.5–15.5)
WBC: 8.4 10*3/uL (ref 4.0–10.5)
nRBC: 0 % (ref 0.0–0.2)

## 2021-12-22 LAB — BASIC METABOLIC PANEL
Anion gap: 7 (ref 5–15)
BUN: 17 mg/dL (ref 8–23)
CO2: 25 mmol/L (ref 22–32)
Calcium: 9.1 mg/dL (ref 8.9–10.3)
Chloride: 108 mmol/L (ref 98–111)
Creatinine, Ser: 1.19 mg/dL (ref 0.61–1.24)
GFR, Estimated: >60 mL/min (ref >60)
Glucose, Bld: 100 mg/dL — ABNORMAL HIGH (ref 70–99)
Potassium: 3.7 mmol/L (ref 3.5–5.1)
Sodium: 140 mmol/L (ref 135–145)

## 2021-12-22 LAB — PROTIME-INR
INR: 1 (ref 0.8–1.2)
Prothrombin Time: 13.4 seconds (ref 11.4–15.2)

## 2021-12-22 LAB — SURGICAL PCR SCREEN
MRSA, PCR: NEGATIVE
Staphylococcus aureus: NEGATIVE

## 2021-12-22 NOTE — Progress Notes (Signed)
PCP:  Donnamarie Rossetti, MD ?Cardiologist:  Wallie Char, MD ? ?EKG: 12/23/21 ?CXR:  na ?ECHO:  denies ?Stress Test:  denies ?Cardiac Cath:  denies ? ?Fasting Blood Sugar-  na ?Checks Blood Sugar__na_ times a day ? ?OSA/CPAP: Yes, wears cpap nightly ? ?ASA: Last dose 12/22/21 ?Blood Thinner: No ? ?Covid test not needed ? ?Anesthesia Review: No ? ?Patient denies shortness of breath, fever, cough, and chest pain at PAT appointment. ? ?Patient verbalized understanding of instructions provided today at the PAT appointment.  Patient asked to review instructions at home and day of surgery.   ?

## 2021-12-28 NOTE — Anesthesia Preprocedure Evaluation (Addendum)
Anesthesia Evaluation  ?Patient identified by MRN, date of birth, ID band ?Patient awake ? ? ? ?Reviewed: ?Allergy & Precautions, NPO status , reviewed documented beta blocker date and time  ? ?Airway ?Mallampati: II ? ?TM Distance: >3 FB ? ? ? ? Dental ?  ?Pulmonary ?sleep apnea ,  ?  ?breath sounds clear to auscultation ? ? ? ? ? ? Cardiovascular ?hypertension,  ?Rhythm:Regular Rate:Normal ? ? ?  ?Neuro/Psych ?  ? GI/Hepatic ?negative GI ROS, Neg liver ROS,   ?Endo/Other  ?negative endocrine ROS ? Renal/GU ?negative Renal ROS  ? ?  ?Musculoskeletal ? ?(+) Arthritis ,  ? Abdominal ?  ?Peds ? Hematology ?  ?Anesthesia Other Findings ? ? Reproductive/Obstetrics ? ?  ? ? ? ? ? ? ? ? ? ? ? ? ? ?  ?  ? ? ? ? ? ? ? ?Anesthesia Physical ?Anesthesia Plan ? ?ASA: 3 ? ?Anesthesia Plan: General  ? ?Post-op Pain Management:   ? ?Induction: Intravenous ? ?PONV Risk Score and Plan: Ondansetron, Dexamethasone and Midazolam ? ?Airway Management Planned: Oral ETT ? ?Additional Equipment:  ? ?Intra-op Plan:  ? ?Post-operative Plan: Possible Post-op intubation/ventilation ? ?Informed Consent: I have reviewed the patients History and Physical, chart, labs and discussed the procedure including the risks, benefits and alternatives for the proposed anesthesia with the patient or authorized representative who has indicated his/her understanding and acceptance.  ? ? ? ?Dental advisory given ? ?Plan Discussed with: Anesthesiologist and CRNA ? ?Anesthesia Plan Comments:   ? ? ? ? ? ?Anesthesia Quick Evaluation ? ?

## 2021-12-29 ENCOUNTER — Encounter (HOSPITAL_COMMUNITY)
Admission: RE | Disposition: A | Discharge status: Home / Self Care | Payer: Self-pay | Source: Home / Self Care | Attending: Neurological Surgery

## 2021-12-29 ENCOUNTER — Inpatient Hospital Stay (HOSPITAL_COMMUNITY): Payer: Medicare Other

## 2021-12-29 ENCOUNTER — Encounter (HOSPITAL_COMMUNITY): Payer: Self-pay | Admitting: Neurological Surgery

## 2021-12-29 ENCOUNTER — Other Ambulatory Visit: Payer: Self-pay

## 2021-12-29 ENCOUNTER — Inpatient Hospital Stay (HOSPITAL_COMMUNITY): Payer: Medicare Other | Admitting: Anesthesiology

## 2021-12-29 ENCOUNTER — Inpatient Hospital Stay (HOSPITAL_COMMUNITY): Payer: Medicare Other | Admitting: Physician Assistant

## 2021-12-29 ENCOUNTER — Inpatient Hospital Stay (HOSPITAL_COMMUNITY)
Admission: RE | Admit: 2021-12-29 | Discharge: 2021-12-30 | DRG: 472 | Disposition: A | Discharge status: Home / Self Care | Payer: Medicare Other | Attending: Neurological Surgery | Admitting: Neurological Surgery

## 2021-12-29 DIAGNOSIS — M4313 Spondylolisthesis, cervicothoracic region: Secondary | ICD-10-CM | POA: Diagnosis present

## 2021-12-29 DIAGNOSIS — I10 Essential (primary) hypertension: Secondary | ICD-10-CM | POA: Diagnosis present

## 2021-12-29 DIAGNOSIS — I6509 Occlusion and stenosis of unspecified vertebral artery: Secondary | ICD-10-CM | POA: Diagnosis present

## 2021-12-29 DIAGNOSIS — Z7982 Long term (current) use of aspirin: Secondary | ICD-10-CM | POA: Diagnosis not present

## 2021-12-29 DIAGNOSIS — G4733 Obstructive sleep apnea (adult) (pediatric): Secondary | ICD-10-CM | POA: Diagnosis present

## 2021-12-29 DIAGNOSIS — M5013 Cervical disc disorder with radiculopathy, cervicothoracic region: Secondary | ICD-10-CM | POA: Diagnosis present

## 2021-12-29 DIAGNOSIS — M4802 Spinal stenosis, cervical region: Secondary | ICD-10-CM

## 2021-12-29 DIAGNOSIS — E785 Hyperlipidemia, unspecified: Secondary | ICD-10-CM | POA: Diagnosis present

## 2021-12-29 DIAGNOSIS — M4312 Spondylolisthesis, cervical region: Secondary | ICD-10-CM | POA: Diagnosis present

## 2021-12-29 DIAGNOSIS — I6523 Occlusion and stenosis of bilateral carotid arteries: Secondary | ICD-10-CM | POA: Diagnosis present

## 2021-12-29 DIAGNOSIS — I161 Hypertensive emergency: Secondary | ICD-10-CM | POA: Diagnosis not present

## 2021-12-29 DIAGNOSIS — Z96653 Presence of artificial knee joint, bilateral: Secondary | ICD-10-CM | POA: Diagnosis present

## 2021-12-29 DIAGNOSIS — R2990 Unspecified symptoms and signs involving the nervous system: Secondary | ICD-10-CM

## 2021-12-29 DIAGNOSIS — E669 Obesity, unspecified: Secondary | ICD-10-CM | POA: Diagnosis present

## 2021-12-29 DIAGNOSIS — Z6830 Body mass index (BMI) 30.0-30.9, adult: Secondary | ICD-10-CM | POA: Diagnosis not present

## 2021-12-29 DIAGNOSIS — R29810 Facial weakness: Secondary | ICD-10-CM | POA: Diagnosis not present

## 2021-12-29 DIAGNOSIS — M4803 Spinal stenosis, cervicothoracic region: Principal | ICD-10-CM | POA: Diagnosis present

## 2021-12-29 DIAGNOSIS — R200 Anesthesia of skin: Secondary | ICD-10-CM | POA: Diagnosis not present

## 2021-12-29 DIAGNOSIS — Z79899 Other long term (current) drug therapy: Secondary | ICD-10-CM | POA: Diagnosis not present

## 2021-12-29 DIAGNOSIS — Z885 Allergy status to narcotic agent status: Secondary | ICD-10-CM

## 2021-12-29 DIAGNOSIS — M5412 Radiculopathy, cervical region: Secondary | ICD-10-CM

## 2021-12-29 DIAGNOSIS — M1990 Unspecified osteoarthritis, unspecified site: Secondary | ICD-10-CM | POA: Diagnosis present

## 2021-12-29 DIAGNOSIS — Z981 Arthrodesis status: Principal | ICD-10-CM

## 2021-12-29 HISTORY — PX: POSTERIOR CERVICAL FUSION/FORAMINOTOMY: SHX5038

## 2021-12-29 LAB — HEMOGLOBIN A1C
Hgb A1c MFr Bld: 5.7 % — ABNORMAL HIGH (ref 4.8–5.6)
Mean Plasma Glucose: 116.89 mg/dL

## 2021-12-29 LAB — GLUCOSE, CAPILLARY: Glucose-Capillary: 176 mg/dL — ABNORMAL HIGH (ref 70–99)

## 2021-12-29 SURGERY — POSTERIOR CERVICAL FUSION/FORAMINOTOMY LEVEL 1
Anesthesia: General | Laterality: Bilateral

## 2021-12-29 MED ORDER — PHENYLEPHRINE HCL-NACL 20-0.9 MG/250ML-% IV SOLN
INTRAVENOUS | Status: DC | PRN
Start: 2021-12-29 — End: 2021-12-29
  Administered 2021-12-29: 40 ug/min via INTRAVENOUS

## 2021-12-29 MED ORDER — GABAPENTIN 300 MG PO CAPS
300.0000 mg | ORAL_CAPSULE | ORAL | Status: AC
  Administered 2021-12-29: 300 mg via ORAL
  Filled 2021-12-29: qty 1

## 2021-12-29 MED ORDER — PHENOL 1.4 % MT LIQD: 1.0000 | OROMUCOSAL | Status: DC | PRN

## 2021-12-29 MED ORDER — ARTIFICIAL TEARS OPHTHALMIC OINT
TOPICAL_OINTMENT | OPHTHALMIC | Status: DC | PRN
  Administered 2021-12-29: 1 via OPHTHALMIC

## 2021-12-29 MED ORDER — MORPHINE SULFATE (PF) 2 MG/ML IV SOLN
2.0000 mg | INTRAVENOUS | Status: DC | PRN
  Administered 2021-12-29: 2 mg via INTRAVENOUS
  Filled 2021-12-29: qty 1

## 2021-12-29 MED ORDER — BACITRACIN ZINC 500 UNIT/GM EX OINT
TOPICAL_OINTMENT | CUTANEOUS | Status: AC
  Filled 2021-12-29: qty 28.35

## 2021-12-29 MED ORDER — FENTANYL CITRATE (PF) 250 MCG/5ML IJ SOLN
INTRAMUSCULAR | Status: DC | PRN
  Administered 2021-12-29: 20 ug via INTRAVENOUS
  Administered 2021-12-29: 100 ug via INTRAVENOUS
  Administered 2021-12-29: 50 ug via INTRAVENOUS
  Administered 2021-12-29: 10 ug via INTRAVENOUS

## 2021-12-29 MED ORDER — CELECOXIB 200 MG PO CAPS
200.0000 mg | ORAL_CAPSULE | Freq: Two times a day (BID) | ORAL | Status: DC
  Administered 2021-12-29 – 2021-12-30 (×3): 200 mg via ORAL
  Filled 2021-12-29 (×3): qty 1

## 2021-12-29 MED ORDER — HYDRALAZINE HCL 20 MG/ML IJ SOLN
INTRAMUSCULAR | Status: AC
  Filled 2021-12-29: qty 1

## 2021-12-29 MED ORDER — DEXAMETHASONE SODIUM PHOSPHATE 10 MG/ML IJ SOLN
INTRAMUSCULAR | Status: AC
  Filled 2021-12-29: qty 1

## 2021-12-29 MED ORDER — HEMOSTATIC AGENTS (NO CHARGE) OPTIME
TOPICAL | Status: DC | PRN
  Administered 2021-12-29: 1 via TOPICAL

## 2021-12-29 MED ORDER — EZETIMIBE-SIMVASTATIN 10-20 MG PO TABS: 1.0000 | ORAL_TABLET | Freq: Every evening | ORAL | Status: DC

## 2021-12-29 MED ORDER — THROMBIN 5000 UNITS EX SOLR
OROMUCOSAL | Status: DC | PRN
  Administered 2021-12-29: 5 mL via TOPICAL

## 2021-12-29 MED ORDER — HYDRALAZINE HCL 20 MG/ML IJ SOLN
20.0000 mg | Freq: Once | INTRAMUSCULAR | Status: AC
  Administered 2021-12-29: 20 mg via INTRAVENOUS
  Filled 2021-12-29: qty 1

## 2021-12-29 MED ORDER — SUCCINYLCHOLINE CHLORIDE 200 MG/10ML IV SOSY
PREFILLED_SYRINGE | INTRAVENOUS | Status: AC
  Filled 2021-12-29: qty 10

## 2021-12-29 MED ORDER — ORAL CARE MOUTH RINSE: 15.0000 mL | Freq: Once | OROMUCOSAL | Status: AC

## 2021-12-29 MED ORDER — LIDOCAINE 2% (20 MG/ML) 5 ML SYRINGE
INTRAMUSCULAR | Status: AC
  Filled 2021-12-29: qty 5

## 2021-12-29 MED ORDER — BUPIVACAINE HCL (PF) 0.25 % IJ SOLN
INTRAMUSCULAR | Status: DC | PRN
  Administered 2021-12-29: 3 mL

## 2021-12-29 MED ORDER — IOHEXOL 350 MG/ML SOLN
75.0000 mL | Freq: Once | INTRAVENOUS | Status: AC | PRN
Start: 2021-12-29 — End: 2021-12-29
  Administered 2021-12-29: 75 mL via INTRAVENOUS

## 2021-12-29 MED ORDER — ONDANSETRON HCL 4 MG PO TABS: 4.0000 mg | ORAL_TABLET | Freq: Four times a day (QID) | ORAL | Status: DC | PRN

## 2021-12-29 MED ORDER — OXYCODONE HCL 5 MG PO TABS
10.0000 mg | ORAL_TABLET | ORAL | Status: DC | PRN
  Administered 2021-12-29: 5 mg via ORAL
  Administered 2021-12-29 – 2021-12-30 (×3): 10 mg via ORAL
  Filled 2021-12-29 (×4): qty 2

## 2021-12-29 MED ORDER — SODIUM CHLORIDE 0.9% FLUSH
3.0000 mL | Freq: Two times a day (BID) | INTRAVENOUS | Status: DC
  Administered 2021-12-30 (×2): 3 mL via INTRAVENOUS

## 2021-12-29 MED ORDER — THROMBIN 5000 UNITS EX SOLR
CUTANEOUS | Status: AC
  Filled 2021-12-29: qty 15000

## 2021-12-29 MED ORDER — METHOCARBAMOL 500 MG PO TABS
500.0000 mg | ORAL_TABLET | Freq: Four times a day (QID) | ORAL | Status: DC | PRN
  Administered 2021-12-29 – 2021-12-30 (×2): 500 mg via ORAL
  Filled 2021-12-29 (×2): qty 1

## 2021-12-29 MED ORDER — LOSARTAN POTASSIUM 50 MG PO TABS
100.0000 mg | ORAL_TABLET | Freq: Every day | ORAL | Status: DC
  Administered 2021-12-29 – 2021-12-30 (×2): 100 mg via ORAL
  Filled 2021-12-29 (×2): qty 2

## 2021-12-29 MED ORDER — LIDOCAINE 2% (20 MG/ML) 5 ML SYRINGE
INTRAMUSCULAR | Status: DC | PRN
  Administered 2021-12-29: 100 mg via INTRAVENOUS

## 2021-12-29 MED ORDER — VANCOMYCIN HCL 1000 MG IV SOLR
INTRAVENOUS | Status: AC
  Filled 2021-12-29: qty 20

## 2021-12-29 MED ORDER — DEXAMETHASONE 4 MG PO TABS
4.0000 mg | ORAL_TABLET | Freq: Four times a day (QID) | ORAL | Status: DC
  Administered 2021-12-29 – 2021-12-30 (×5): 4 mg via ORAL
  Filled 2021-12-29 (×5): qty 1

## 2021-12-29 MED ORDER — HYDROCHLOROTHIAZIDE 25 MG PO TABS
25.0000 mg | ORAL_TABLET | Freq: Every day | ORAL | Status: DC
  Administered 2021-12-29 – 2021-12-30 (×2): 25 mg via ORAL
  Filled 2021-12-29 (×2): qty 1

## 2021-12-29 MED ORDER — SUCCINYLCHOLINE CHLORIDE 200 MG/10ML IV SOSY
PREFILLED_SYRINGE | INTRAVENOUS | Status: DC | PRN
  Administered 2021-12-29: 140 mg via INTRAVENOUS

## 2021-12-29 MED ORDER — PROPOFOL 10 MG/ML IV BOLUS
INTRAVENOUS | Status: DC | PRN
  Administered 2021-12-29: 160 mg via INTRAVENOUS
  Administered 2021-12-29: 30 mg via INTRAVENOUS
  Administered 2021-12-29: 50 mg via INTRAVENOUS

## 2021-12-29 MED ORDER — FENTANYL CITRATE (PF) 250 MCG/5ML IJ SOLN
INTRAMUSCULAR | Status: AC
  Filled 2021-12-29: qty 5

## 2021-12-29 MED ORDER — ACETAMINOPHEN 500 MG PO TABS
1000.0000 mg | ORAL_TABLET | Freq: Four times a day (QID) | ORAL | Status: AC
  Administered 2021-12-29 – 2021-12-30 (×3): 1000 mg via ORAL
  Filled 2021-12-29 (×3): qty 2

## 2021-12-29 MED ORDER — CHLORHEXIDINE GLUCONATE CLOTH 2 % EX PADS: 6.0000 | MEDICATED_PAD | Freq: Once | CUTANEOUS | Status: DC

## 2021-12-29 MED ORDER — POTASSIUM CHLORIDE IN NACL 20-0.9 MEQ/L-% IV SOLN: INTRAVENOUS | Status: DC

## 2021-12-29 MED ORDER — PROPOFOL 10 MG/ML IV BOLUS
INTRAVENOUS | Status: AC
  Filled 2021-12-29: qty 20

## 2021-12-29 MED ORDER — SUGAMMADEX SODIUM 200 MG/2ML IV SOLN
INTRAVENOUS | Status: DC | PRN
  Administered 2021-12-29: 204.2 mg via INTRAVENOUS

## 2021-12-29 MED ORDER — THROMBIN 5000 UNITS EX SOLR
CUTANEOUS | Status: DC | PRN
Start: 2021-12-29 — End: 2021-12-29
  Administered 2021-12-29: 10000 [IU] via TOPICAL

## 2021-12-29 MED ORDER — MENTHOL 3 MG MT LOZG: 1.0000 | LOZENGE | OROMUCOSAL | Status: DC | PRN

## 2021-12-29 MED ORDER — LOSARTAN POTASSIUM-HCTZ 100-25 MG PO TABS: 1.0000 | ORAL_TABLET | Freq: Every morning | ORAL | Status: DC

## 2021-12-29 MED ORDER — ASPIRIN 325 MG PO TABS: 325.0000 mg | ORAL_TABLET | Freq: Every day | ORAL | Status: DC

## 2021-12-29 MED ORDER — ASPIRIN 325 MG PO TABS
325.0000 mg | ORAL_TABLET | Freq: Every day | ORAL | Status: DC
Start: 2021-12-29 — End: 2021-12-30
  Administered 2021-12-29 – 2021-12-30 (×2): 325 mg via ORAL
  Filled 2021-12-29 (×2): qty 1

## 2021-12-29 MED ORDER — SIMVASTATIN 20 MG PO TABS
20.0000 mg | ORAL_TABLET | Freq: Every day | ORAL | Status: DC
  Administered 2021-12-29: 20 mg via ORAL
  Filled 2021-12-29: qty 1

## 2021-12-29 MED ORDER — SODIUM CHLORIDE 0.9% FLUSH: 3.0000 mL | INTRAVENOUS | Status: DC | PRN

## 2021-12-29 MED ORDER — LATANOPROST 0.005 % OP SOLN
1.0000 [drp] | Freq: Every day | OPHTHALMIC | Status: DC
  Filled 2021-12-29: qty 2.5

## 2021-12-29 MED ORDER — LACTATED RINGERS IV SOLN: INTRAVENOUS | Status: DC

## 2021-12-29 MED ORDER — ACETAMINOPHEN 500 MG PO TABS
1000.0000 mg | ORAL_TABLET | ORAL | Status: AC
  Administered 2021-12-29: 1000 mg via ORAL
  Filled 2021-12-29: qty 2

## 2021-12-29 MED ORDER — SENNA 8.6 MG PO TABS
1.0000 | ORAL_TABLET | Freq: Two times a day (BID) | ORAL | Status: DC
  Administered 2021-12-29 – 2021-12-30 (×3): 8.6 mg via ORAL
  Filled 2021-12-29 (×3): qty 1

## 2021-12-29 MED ORDER — SODIUM CHLORIDE 0.9 % IV SOLN: INTRAVENOUS | Status: DC | PRN

## 2021-12-29 MED ORDER — ONDANSETRON HCL 4 MG/2ML IJ SOLN
INTRAMUSCULAR | Status: DC | PRN
  Administered 2021-12-29: 4 mg via INTRAVENOUS

## 2021-12-29 MED ORDER — BACITRACIN ZINC 500 UNIT/GM EX OINT
TOPICAL_OINTMENT | CUTANEOUS | Status: DC | PRN
  Administered 2021-12-29: 1 via TOPICAL

## 2021-12-29 MED ORDER — ONDANSETRON HCL 4 MG/2ML IJ SOLN: 4.0000 mg | Freq: Four times a day (QID) | INTRAMUSCULAR | Status: DC | PRN

## 2021-12-29 MED ORDER — ROCURONIUM BROMIDE 10 MG/ML (PF) SYRINGE
PREFILLED_SYRINGE | INTRAVENOUS | Status: AC
  Filled 2021-12-29: qty 10

## 2021-12-29 MED ORDER — BUPIVACAINE HCL (PF) 0.25 % IJ SOLN
INTRAMUSCULAR | Status: AC
  Filled 2021-12-29: qty 30

## 2021-12-29 MED ORDER — EZETIMIBE 10 MG PO TABS
10.0000 mg | ORAL_TABLET | Freq: Every day | ORAL | Status: DC
  Administered 2021-12-29: 10 mg via ORAL
  Filled 2021-12-29: qty 1

## 2021-12-29 MED ORDER — CHLORHEXIDINE GLUCONATE 0.12 % MT SOLN
15.0000 mL | Freq: Once | OROMUCOSAL | Status: AC
  Administered 2021-12-29: 15 mL via OROMUCOSAL
  Filled 2021-12-29: qty 15

## 2021-12-29 MED ORDER — CEFAZOLIN SODIUM-DEXTROSE 2-4 GM/100ML-% IV SOLN
2.0000 g | Freq: Three times a day (TID) | INTRAVENOUS | Status: AC
  Administered 2021-12-29 – 2021-12-30 (×2): 2 g via INTRAVENOUS
  Filled 2021-12-29 (×2): qty 100

## 2021-12-29 MED ORDER — ARTIFICIAL TEARS OPHTHALMIC OINT
TOPICAL_OINTMENT | OPHTHALMIC | Status: AC
  Filled 2021-12-29: qty 3.5

## 2021-12-29 MED ORDER — CEFAZOLIN SODIUM-DEXTROSE 2-4 GM/100ML-% IV SOLN
2.0000 g | INTRAVENOUS | Status: AC
  Administered 2021-12-29: 2 g via INTRAVENOUS
  Filled 2021-12-29: qty 100

## 2021-12-29 MED ORDER — ROCURONIUM BROMIDE 10 MG/ML (PF) SYRINGE
PREFILLED_SYRINGE | INTRAVENOUS | Status: DC | PRN
  Administered 2021-12-29 (×2): 50 mg via INTRAVENOUS
  Administered 2021-12-29: 30 mg via INTRAVENOUS

## 2021-12-29 MED ORDER — 0.9 % SODIUM CHLORIDE (POUR BTL) OPTIME
TOPICAL | Status: DC | PRN
  Administered 2021-12-29: 1000 mL

## 2021-12-29 MED ORDER — STROKE: EARLY STAGES OF RECOVERY BOOK
Freq: Once | Status: AC
  Filled 2021-12-29: qty 1

## 2021-12-29 MED ORDER — DEXAMETHASONE SODIUM PHOSPHATE 4 MG/ML IJ SOLN
4.0000 mg | Freq: Four times a day (QID) | INTRAMUSCULAR | Status: DC
  Filled 2021-12-29 (×2): qty 1

## 2021-12-29 MED ORDER — METHOCARBAMOL 1000 MG/10ML IJ SOLN
500.0000 mg | Freq: Four times a day (QID) | INTRAVENOUS | Status: DC | PRN
  Filled 2021-12-29: qty 5

## 2021-12-29 MED ORDER — SODIUM CHLORIDE 0.9 % IV SOLN: 250.0000 mL | INTRAVENOUS | Status: DC

## 2021-12-29 MED ORDER — DEXAMETHASONE SODIUM PHOSPHATE 10 MG/ML IJ SOLN
INTRAMUSCULAR | Status: DC | PRN
  Administered 2021-12-29: 10 mg via INTRAVENOUS

## 2021-12-29 MED ORDER — ONDANSETRON HCL 4 MG/2ML IJ SOLN
INTRAMUSCULAR | Status: AC
  Filled 2021-12-29: qty 2

## 2021-12-29 SURGICAL SUPPLY — 55 items
BAG COUNTER SPONGE SURGICOUNT (BAG) ×3 IMPLANT
BENZOIN TINCTURE PRP APPL 2/3 (GAUZE/BANDAGES/DRESSINGS) ×2 IMPLANT
BIT DRILL INVICTUS SUB 2.1 STR (BIT) ×1 IMPLANT
BLADE CLIPPER SURG (BLADE) IMPLANT
BUR CARBIDE MATCH 3.0 (BURR) ×1 IMPLANT
CANISTER SUCT 3000ML PPV (MISCELLANEOUS) ×2 IMPLANT
DERMABOND ADHESIVE PROPEN (GAUZE/BANDAGES/DRESSINGS) ×1
DERMABOND ADVANCED .7 DNX6 (GAUZE/BANDAGES/DRESSINGS) IMPLANT
DRAPE C-ARM 42X72 X-RAY (DRAPES) ×4 IMPLANT
DRAPE LAPAROTOMY 100X72 PEDS (DRAPES) ×2 IMPLANT
DRSG OPSITE POSTOP 4X6 (GAUZE/BANDAGES/DRESSINGS) ×1 IMPLANT
DURAPREP 26ML APPLICATOR (WOUND CARE) ×2 IMPLANT
ELECT BLADE 4.0 EZ CLEAN MEGAD (MISCELLANEOUS) ×2
ELECT REM PT RETURN 9FT ADLT (ELECTROSURGICAL) ×2
ELECTRODE BLDE 4.0 EZ CLN MEGD (MISCELLANEOUS) IMPLANT
ELECTRODE REM PT RTRN 9FT ADLT (ELECTROSURGICAL) ×1 IMPLANT
EVACUATOR 1/8 PVC DRAIN (DRAIN) IMPLANT
GAUZE 4X4 16PLY ~~LOC~~+RFID DBL (SPONGE) IMPLANT
GLOVE BIO SURGEON STRL SZ7 (GLOVE) ×1 IMPLANT
GLOVE BIO SURGEON STRL SZ8 (GLOVE) ×2 IMPLANT
GLOVE BIOGEL PI IND STRL 7.0 (GLOVE) IMPLANT
GLOVE BIOGEL PI INDICATOR 7.0 (GLOVE) ×1
GOWN STRL REUS W/ TWL LRG LVL3 (GOWN DISPOSABLE) IMPLANT
GOWN STRL REUS W/ TWL XL LVL3 (GOWN DISPOSABLE) ×1 IMPLANT
GOWN STRL REUS W/TWL 2XL LVL3 (GOWN DISPOSABLE) IMPLANT
GOWN STRL REUS W/TWL LRG LVL3 (GOWN DISPOSABLE)
GOWN STRL REUS W/TWL XL LVL3 (GOWN DISPOSABLE) ×1
HEMOSTAT POWDER KIT SURGIFOAM (HEMOSTASIS) ×1 IMPLANT
KIT BASIN OR (CUSTOM PROCEDURE TRAY) ×2 IMPLANT
KIT TURNOVER KIT B (KITS) ×2 IMPLANT
MARKER SKIN DUAL TIP RULER LAB (MISCELLANEOUS) ×2 IMPLANT
NDL HYPO 25X1 1.5 SAFETY (NEEDLE) ×1 IMPLANT
NDL SPNL 20GX3.5 QUINCKE YW (NEEDLE) IMPLANT
NEEDLE HYPO 25X1 1.5 SAFETY (NEEDLE) ×2 IMPLANT
NEEDLE SPNL 20GX3.5 QUINCKE YW (NEEDLE) IMPLANT
NS IRRIG 1000ML POUR BTL (IV SOLUTION) ×2 IMPLANT
PACK LAMINECTOMY NEURO (CUSTOM PROCEDURE TRAY) ×2 IMPLANT
PAD ARMBOARD 7.5X6 YLW CONV (MISCELLANEOUS) ×2 IMPLANT
PIN MAYFIELD SKULL DISP (PIN) ×2 IMPLANT
PUTTY DBM 10CC (Putty) ×1 IMPLANT
ROD LORD TI 3.5X25 (Rod) ×2 IMPLANT
SCREW PA 3.5X16 (Screw) ×1 IMPLANT
SCREW PA 3.5X26 (Screw) ×2 IMPLANT
SCREW POLYAXIAL 3.5 X 14 (Screw) ×1 IMPLANT
SCREW SET ATEC (Screw) ×4 IMPLANT
SPONGE SURGIFOAM ABS GEL SZ50 (HEMOSTASIS) ×2 IMPLANT
SPONGE T-LAP 4X18 ~~LOC~~+RFID (SPONGE) IMPLANT
STRIP CLOSURE SKIN 1/2X4 (GAUZE/BANDAGES/DRESSINGS) ×2 IMPLANT
SUT VIC AB 0 CT1 18XCR BRD8 (SUTURE) ×1 IMPLANT
SUT VIC AB 0 CT1 8-18 (SUTURE) ×1
SUT VIC AB 2-0 CP2 18 (SUTURE) ×2 IMPLANT
SUT VIC AB 3-0 SH 8-18 (SUTURE) ×3 IMPLANT
TOWEL GREEN STERILE (TOWEL DISPOSABLE) ×2 IMPLANT
TOWEL GREEN STERILE FF (TOWEL DISPOSABLE) ×2 IMPLANT
WATER STERILE IRR 1000ML POUR (IV SOLUTION) ×2 IMPLANT

## 2021-12-29 NOTE — Anesthesia Procedure Notes (Addendum)
Procedure Name: Intubation ?Date/Time: 12/29/2021 8:54 AM ?Performed by: Maude Leriche, CRNA ?Pre-anesthesia Checklist: Patient identified, Emergency Drugs available, Suction available and Patient being monitored ?Patient Re-evaluated:Patient Re-evaluated prior to induction ?Oxygen Delivery Method: Circle system utilized ?Preoxygenation: Pre-oxygenation with 100% oxygen ?Induction Type: IV induction ?Ventilation: Mask ventilation without difficulty ?Laryngoscope Size: Sabra Heck and 2 ?Grade View: Grade II ?Tube type: Oral ?Tube size: 7.5 mm ?Number of attempts: 1 ?Airway Equipment and Method: Stylet ?Placement Confirmation: ETT inserted through vocal cords under direct vision, positive ETCO2 and breath sounds checked- equal and bilateral ?Secured at: 23 cm ?Tube secured with: Tape ?Dental Injury: Teeth and Oropharynx as per pre-operative assessment  ?Comments: C-spine mobilization maintained ? ? ? ? ?

## 2021-12-29 NOTE — Progress Notes (Signed)
Swallow screen performed by Rapid Pesponse RN. Pt passed, diet order placed. Rema Fendt, RN  ?

## 2021-12-29 NOTE — Progress Notes (Signed)
Pt came up from PACU in a wheelchair and upon arrival to the floor I noticed a left facial droop and Pt c/o left facial numbness. BP 186/87, 191/02, and 207/90. CBG 176. Pt is A&O, no drift in extremities, speech is normal. Rapid response called and a code stroke was activated. Will continue to monitor Pt.  ?

## 2021-12-29 NOTE — Anesthesia Procedure Notes (Addendum)
Arterial Line Insertion ?Start/End5/05/2022 8:10 AM, 12/29/2021 8:15 AM ?Performed by: Dorris Singh, MD, Cy Blamer, CRNA, CRNA ? Patient location: Pre-op. ?Preanesthetic checklist: patient identified, IV checked, site marked, risks and benefits discussed, surgical consent, monitors and equipment checked, pre-op evaluation, timeout performed and anesthesia consent ?Lidocaine 1% used for infiltration ?Left, radial was placed ?Catheter size: 20 G ?Hand hygiene performed , maximum sterile barriers used  and Seldinger technique used ?Allen's test indicative of satisfactory collateral circulation ?Attempts: 1 ?Procedure performed without using ultrasound guided technique. ?Ultrasound Notes:anatomy identified, needle tip was noted to be adjacent to the nerve/plexus identified and no ultrasound evidence of intravascular and/or intraneural injection ?Following insertion, Biopatch and dressing applied. ?Post procedure assessment: unchanged and normal ? ?Patient tolerated the procedure well with no immediate complications. ? ? ?

## 2021-12-29 NOTE — Anesthesia Postprocedure Evaluation (Signed)
Anesthesia Post Note ? ?Patient: Joe Ramos ? ?Procedure(s) Performed: Posterior cervical fusion with lateral mass fixation Cervical Seven-Thoracic One foraminotomies   Cervical Seven-Thoracic One - bilateral (Bilateral) ? ?  ? ?Patient location during evaluation: PACU ?Anesthesia Type: General ?Level of consciousness: awake ?Pain management: pain level controlled ?Vital Signs Assessment: post-procedure vital signs reviewed and stable ?Respiratory status: spontaneous breathing ?Cardiovascular status: stable ?Postop Assessment: no apparent nausea or vomiting ?Anesthetic complications: no ? ? ?No notable events documented. ? ?Last Vitals:  ?Vitals:  ? 12/29/21 1323 12/29/21 1405  ?BP: (!) 207/90 (!) 162/70  ?Pulse: 71   ?Resp: 18   ?Temp:    ?SpO2: 98%   ?  ?Last Pain:  ?Vitals:  ? 12/29/21 1215  ?TempSrc:   ?PainSc: 0-No pain  ? ? ?  ?  ?  ?  ?  ?  ? ?Kariyah Baugh ? ? ? ? ?

## 2021-12-29 NOTE — Consult Note (Signed)
Neurology Consultation ? ?Reason for Consult: Code stroke  ?Referring Physician: Dr. Yetta Barre  ? ?CC: left side numbness and facial droop ? ?History is obtained from:patient and medical record  ? ?HPI: Joe Ramos is a 76 y.o. male with past medical history of HTN, sleep apnea who arrived to his room post-op from procedure when he developed left facial droop and left facial numbness. LKW 0930.  Code stroke called by RN. BP 207/90 and was given 20mg  hydralazine. NIHSS 1. CT head no acute process. CTA head/neck no LVO. Patient brought back to room. Updated family at the bedside. Dr. at bedside, OK for patient to get ASA. Patient will be transferred to a neuro unit for close monitoring  ? ? ?LKW: 0930 ?tpa given?: no, low NIHSS, surgery today  ?Premorbid modified Rankin scale (mRS):  ?0-Completely asymptomatic and back to baseline post-stroke ? ?ROS: Full ROS was performed and is negative except as noted in the HPI.  ? ?Past Medical History:  ?Diagnosis Date  ? Arthritis   ? Hypertension   ? Sleep apnea   ? Umbilical hernia   ? ? ? ?Hypertension Emergency (SBP > 180 or DBP > 120 & end organ damage)  ? ?History reviewed. No pertinent family history. ? ? ?Social History:  ? reports that he has never smoked. He has never used smokeless tobacco. He reports current alcohol use of about 1.0 standard drink per week. He reports that he does not use drugs. ? ?Medications ? ?Current Facility-Administered Medications:  ?  0.9 %  sodium chloride infusion, 250 mL, Intravenous, Continuous, Yetta Barre, MD ?  0.9 % NaCl with KCl 20 mEq/ L  infusion, , Intravenous, Continuous, Tia Alert, MD ?  acetaminophen (TYLENOL) tablet 1,000 mg, 1,000 mg, Oral, Q6H, Tia Alert, MD ?  ceFAZolin (ANCEF) IVPB 2g/100 mL premix, 2 g, Intravenous, Q8H, Tia Alert, MD ?  celecoxib (CELEBREX) capsule 200 mg, 200 mg, Oral, Q12H, Tia Alert, MD ?  dexamethasone (DECADRON) injection 4 mg, 4 mg, Intravenous, Q6H **OR** dexamethasone  (DECADRON) tablet 4 mg, 4 mg, Oral, Q6H, Tia Alert, MD ?  simvastatin (ZOCOR) tablet 20 mg, 20 mg, Oral, q1800 **AND** ezetimibe (ZETIA) tablet 10 mg, 10 mg, Oral, q1800, Reome, Earle J, RPH ?  hydrALAZINE (APRESOLINE) injection 20 mg, 20 mg, Intravenous, Once, Meyran, Tia Alert, NP ?  losartan (COZAAR) tablet 100 mg, 100 mg, Oral, Daily **AND** hydrochlorothiazide (HYDRODIURIL) tablet 25 mg, 25 mg, Oral, Daily, Reome, Earle J, RPH ?  latanoprost (XALATAN) 0.005 % ophthalmic solution 1 drop, 1 drop, Right Eye, QHS, Tiana Loft, MD ?  menthol-cetylpyridinium (CEPACOL) lozenge 3 mg, 1 lozenge, Oral, PRN **OR** phenol (CHLORASEPTIC) mouth spray 1 spray, 1 spray, Mouth/Throat, PRN, Tia Alert, MD ?  methocarbamol (ROBAXIN) tablet 500 mg, 500 mg, Oral, Q6H PRN **OR** methocarbamol (ROBAXIN) 500 mg in dextrose 5 % 50 mL IVPB, 500 mg, Intravenous, Q6H PRN, Tia Alert, MD ?  morphine (PF) 2 MG/ML injection 2 mg, 2 mg, Intravenous, Q2H PRN, Tia Alert, MD ?  ondansetron [facility]) tablet 4 mg, 4 mg, Oral, Q6H PRN **OR** ondansetron (ZOFRAN) injection 4 mg, 4 mg, Intravenous, Q6H PRN, [facility], MD ?  oxyCODONE (Oxy IR/ROXICODONE) immediate release tablet 10 mg, 10 mg, Oral, Q3H PRN, Tia Alert, MD ?  senna [facility]) tablet 8.6 mg, 1 tablet, Oral, BID, [facility], MD ?  sodium chloride flush (NS) 0.9 % injection 3  mL, 3 mL, Intravenous, Q12H, Tia Alert, MD ?  sodium chloride flush (NS) 0.9 % injection 3 mL, 3 mL, Intravenous, PRN, Tia Alert, MD ? ? ?Exam: ?Current vital signs: ?BP (!) 207/90   Pulse 71   Temp 98 ?F (36.7 ?C)   Resp 18   Ht 6' (1.829 m)   Wt 102.1 kg   SpO2 98%   BMI 30.52 kg/m?  ?Vital signs in last 24 hours: ?Temp:  [97.7 ?F (36.5 ?C)-98 ?F (36.7 ?C)] 98 ?F (36.7 ?C) (05/10 1215) ?Pulse Rate:  [62-71] 71 (05/10 1323) ?Resp:  [17-18] 18 (05/10 1323) ?BP: (157-207)/(77-102) 207/90 (05/10 1323) ?SpO2:  [94 %-100 %] 98 % (05/10 1323) ?Weight:  [102.1 kg]  102.1 kg (05/10 0711) ? ?GENERAL: Awake, alert in NAD ?HEENT: - Normocephalic and atraumatic, dry mm ?LUNGS - Clear to auscultation bilaterally with no wheezes ?CV - S1S2 RRR, no m/r/g, equal pulses bilaterally. ?ABDOMEN - Soft, nontender, nondistended with normoactive BS ?Ext: warm, well perfused, intact peripheral pulses, no edema ? ?NEURO:  ?Mental Status: AA&Ox4 ?Language: speech is clear.  Naming, repetition, fluency, and comprehension intact. ?Cranial Nerves: PERRL 43mm/brisk. EOMI, visual fields full, left facial asymmetry, facial sensation intact, hearing intact, tongue/uvula/soft palate midline, normal sternocleidomastoid and trapezius muscle strength. No evidence of tongue atrophy or fibrillations ?Motor: 5/5 in all 4 extremities  ?Tone: is normal and bulk is normal ?Sensation- Intact to light touch bilaterally ?Coordination: FTN intact bilaterally, no ataxia in BLE. ?Gait- deferred ? ?NIHSS ?1a Level of Conscious.: 0 ?1b LOC Questions: 0 ?1c LOC Commands: 0 ?2 Best Gaze: 0 ?3 Visual: 0 ?4 Facial Palsy: 1 ?5a Motor Arm - left: 0 ?5b Motor Arm - Right: 0 ?6a Motor Leg - Left: 0 ?6b Motor Leg - Right: 0 ?7 Limb Ataxia: 0 ?8 Sensory: 0 ?9 Best Language: 0 ?10 Dysarthria: 0 ?11 Extinct. and Inatten.: 0 ?TOTAL: 1  ? ?Imaging ?I have reviewed the images obtained: ? ?Code stroke CT-head ?No acute intracranial process. ASPECTS is 10 ? ?Code stroke CTA H/N: ?1. Negative for intracranial large vessel occlusion or significant stenosis. ?2. 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 25% diameter stenosis proximal right internal carotid artery. Left carotid without significant stenosis ?3. Mild stenosis of the distal vertebral artery above the foramen magnum bilaterally due to atherosclerotic disease. ?4. Posterior surgical hardware fusion at C7-T1. Hardware in good position. ? ?Assessment:  ?Joe Ramos is a 76 y.o. male with past medical history of HTN, sleep apnea who arrived to his room  post-op from procedure when he developed left facial droop and left facial numbness. LKW 0930.  Code stroke called by RN. BP 207/90 and was given 20mg  hydralazine. NIHSS 1. CT head no acute process. CTA head/neck no LVO. ? ?Recommendations: ?- Transfer to neuro unit for frequent monitoring  ?- HgbA1c, fasting lipid panel ?- MRI of the brain without contrast ?- EKG  ?- Frequent neuro checks ?- Echocardiogram ?- Bedside swallow eval- already done  ?- Prophylactic therapy-Antiplatelet med: Aspirin - dose 325mg  -already ordered ?- Risk factor modification ?- Telemetry monitoring ?- PT consult, OT consult, Speech consult ?- Stroke team to follow  ? ? DNP, [id number]  ? ? ?

## 2021-12-29 NOTE — Transfer of Care (Signed)
Immediate Anesthesia Transfer of Care Note ? ?Patient: VOYD GROFT ? ?Procedure(s) Performed: Posterior cervical fusion with lateral mass fixation Cervical Seven-Thoracic One foraminotomies   Cervical Seven-Thoracic One - bilateral (Bilateral) ? ?Patient Location: PACU ? ?Anesthesia Type:General ? ?Level of Consciousness: awake, alert  and oriented ? ?Airway & Oxygen Therapy: Patient Spontanous Breathing and Patient connected to face mask oxygen ? ?Post-op Assessment: Report given to RN, Post -op Vital signs reviewed and stable and Patient able to stick tongue midline ? ?Post vital signs: Reviewed ? ?Last Vitals:  ?Vitals Value Taken Time  ?BP 160/85   ?Temp 97..8   ?Pulse 63 12/29/21 1140  ?Resp 15 12/29/21 1140  ?SpO2 99 % 12/29/21 1140  ?Vitals shown include unvalidated device data. ? ?Last Pain:  ?Vitals:  ? 12/29/21 0745  ?TempSrc:   ?PainSc: 0-No pain  ?   ? ?  ? ?Complications: No notable events documented. ?

## 2021-12-29 NOTE — Op Note (Signed)
12/29/2021 ? ?11:34 AM ? ?PATIENT:  Joe Ramos  76 y.o. male ? 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 ?POST-OPERATIVE DIAGNOSIS:  same ? ?PROCEDURE:  1.  Decompressive bilateral hemilaminectomy, medial facetectomy, and foraminotomies C7-T1 for decompression of the C8 nerve roots, 2.  Posterior cervical arthrodesis C7-T1 utilizing morselized autograft and morselized allograft, 3.  Nonsegmental fixation C7-T1 with Alphatec lateral mass screws at C7 and pedicle screws at T1 ? ?SURGEON:  Marikay Alar, MD ? ?ASSISTANTS: Verlin Dike FNP ? ?ANESTHESIA:   General ? ?EBL: 150 ml ? ?Total I/O ?In: 1500 [I.V.:1500] ?Out: 150 [Blood:150] ? ?BLOOD ADMINISTERED: none ? ?DRAINS: None ? ?SPECIMEN:  none ? ?INDICATION FOR PROCEDURE: This patient presented with neck pain and right arm pain. Imaging showed subluxation of C7 on T1 with severe foraminal stenosis. The patient tried conservative measures without relief. Pain was debilitating. Recommended decompressive hemilaminectomy medial facetectomy with foraminotomies at C7-T1 followed by instrumented fusion. Patient understood the risks, benefits, and alternatives and potential outcomes and wished to proceed. ? ?PROCEDURE DETAILS: The patient was brought to the operating room. Generalized endotracheal anesthesia was induced. The patient was affixed a 3 point [place] headrest and rolled into the prone position on chest rolls. All pressure points were padded. The posterior cervical region was cleaned and prepped with DuraPrep and then draped in the usual sterile fashion. 7 cc of local anesthesia was injected and a dorsal midline incision made in the posterior cervical region and carried down to the cervical fascia. The fascia was opened and the paraspinous musculature was taken down to expose C7-T1. Intraoperative fluoroscopy confirmed my level and then the dissection was carried out over  the lateral facets.  It was difficult to Benefiel the correct level at C7-T1 we spent considerable time using AP and lateral fluoroscopy to do this.  We dissected up to C5-6 and placed an instrument at C5-6 to confirm the level, and then did our decompression 2 levels below this at C7-T1.  All in the room agreed that we are at the right level and we did our decompression and fusion.  I localized the midpoint of each lateral mass of C7 and marked a region 1 mm medial to the midpoint of the lateral mass, and then drilled in an upward and outward direction into the safe zone of each lateral mass. I drilled to a depth of 14 mm and then checked my drill hole with a ball probe. I then placed a 16 mm lateral mass screws into the safe zone of each lateral mass of C7 until they were 2 fingers tight.  We then performed keyhole foraminotomies at C7-T1 bilaterally.  All bone was saved during the decompression for later arthrodesis.  Medial facetectomies were performed, and foraminotomies were performed at C7-T1 especially on the right-hand side where he had a right C8 radiculopathy preoperatively.  That nerve root was quite impressed in the foramen and we spent considerable time widen our foraminotomies to decompress the nerve root until the nerve root passed easily along the root.  Skeletonized the pedicle.  We then localized the pedicle screw entry zones utilizing surface landmarks and fluoroscopy.  Drilled into each pedicle to a depth of 22 mm and palpated with a ball probe.  We tapped each pedicle and then placed 26 mm pedicle screws into the T1 pedicles bilaterally.  I then decorticated the lateral masses and the facet joints and packed them with local autograft and morcellized allograft  to perform arthrodesis from C7-T1. I then placed rods into the multiaxial screw heads of the screws and locked these into position with the locking caps and anti-torque device. I then checked the final construct with AP/Lat fluoroscopy. I  irrigated with saline solution containing bacitracin. I lined the dura with Gelfoam. After hemostasis was achieved I closed the muscle and the fascia with 0 Vicryl, subcutaneous tissue with 2-0 Vicryl, and the subcuticular tissue with 3-0 Vicryl. The skin was closed with benzoin and Steri-Strips. A sterile dressing was applied, the patient was turned to the supine position and taken out of the headrest, awakened from general anesthesia and transferred to the recovery room in stable condition. At the end of the procedure all sponge, needle and instrument counts were correct.  My nurse practitioner was scrubbed for the entire case and helped with the exposure, the placement of the screws, the decompression, and the fusion as well as the closure ? ? ?PLAN OF CARE: Admit to inpatient  ? ?PATIENT DISPOSITION:  PACU - hemodynamically stable. ?  ?Delay start of Pharmacological VTE agent (>24hrs) due to surgical blood loss or risk of bleeding:  yes ? ? ? ? ? ? ? ? ? ? ? ? ? ? ?

## 2021-12-29 NOTE — Code Documentation (Signed)
Stroke Response Nurse Documentation ?Code Documentation ? ?Joe Ramos is a 76 y.o. male arriving to an inpatient room post-op. LKW 0930 when procedure began. Upon arrival to [facility] patient complained of left facial numbness and RN noticed a facial droop.  ? ?Patient hypertensive upon stroke team arrival- 20mg  Hydralazine given. Patient to CT with team. NIHSS 1, see documentation for details and code stroke times. Patient with left facial droop on exam. The following imaging was completed:  CT Head and CTA. Patient is not a candidate for IV Thrombolytic due to recent surgery and no significant symptoms. Patient is not a candidate for IR due to no suspected LVO.  ? ? ?Bedside handoff with RN .   ? ?Morrie Sheldon  ?Stroke Response RN ? ? ?

## 2021-12-29 NOTE — H&P (Signed)
Subjective:  ? ?Patient is a 76 y.o. male admitted for RUE radiculopathy. The patient first presented to me with complaints of neck pain, shooting pains in the arm(s), and numbness of the arm(s). Onset of symptoms was several months ago. The pain is described as aching and occurs all day. The pain is rated severe, and is located in the neck and radiates to the RUE. The symptoms have been progressive. Symptoms are exacerbated by extending head backwards, and are relieved by none.  Previous work up includes MRI of cervical spine, results: spinal stenosis. ? ?Past Medical History:  ?Diagnosis Date  ? Arthritis   ? Hypertension   ? Sleep apnea   ? Umbilical hernia   ?  ?Past Surgical History:  ?Procedure Laterality Date  ? eye lid surgery Bilateral   ? EYE SURGERY    ? as a child  ? 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 REPLACEMENT TOTAL KNEE Left 2018  ?  ?Allergies  ?Allergen Reactions  ? Codeine Nausea And Vomiting  ? Tramadol Nausea And Vomiting  ?  ?Social History  ? ?Tobacco Use  ? Smoking status: Never  ? Smokeless tobacco: Never  ?Substance Use Topics  ? Alcohol use: Yes  ?  Alcohol/week: 1.0 standard drink  ?  Types: 1 Shots of liquor per week  ?  ?History reviewed. No pertinent family history. ?Prior to Admission medications   ?Medication Sig Start Date End Date Taking? Authorizing Provider  ?aspirin EC 81 MG tablet Take 81 mg by mouth in the morning and at bedtime. Swallow whole.   Yes [provider]  ?Coenzyme Q10 (COQ10) 200 MG CAPS Take 200 mg by mouth every evening.   Yes [provider]  ?ezetimibe-simvastatin (VYTORIN) 10-20 MG tablet Take 1 tablet by mouth every evening.   Yes [provider]  ?Iron-Vitamins (GERITOL COMPLETE) TABS Take 1 tablet by mouth in the morning.   Yes [provider]  ?latanoprost (XALATAN) 0.005 % ophthalmic solution Place 1 drop into the right eye at bedtime. 11/02/21  Yes [provider]  ?losartan-hydrochlorothiazide  (HYZAAR) 100-25 MG tablet Take 1 tablet by mouth in the morning. 12/16/21  Yes [provider]  ?Multiple Vitamins-Minerals (PRESERVISION AREDS 2 PO) Take 1 capsule by mouth in the morning and at bedtime.   Yes [provider]  ?Nutritional Supplements (NUTRITIONAL SUPPLEMENT PO) Take 1 capsule by mouth in the morning. Protandim Nrf2 Synergizer (Turmeric, Green Tea, Bacopa, Ashwagandha & Milk Thistle)   Yes [provider]  ?Propylene Glycol-Glycerin 1-0.3 % SOLN Apply 1 drop to eye 3 (three) times daily as needed (dry eyes).   Yes [provider]  ?QUERCETIN PO Take 1 capsule by mouth in the morning.   Yes [provider]  ?vitamin B-12 (CYANOCOBALAMIN) 500 MCG tablet Take 500 mcg by mouth daily.   Yes [provider]  ?zinc sulfate 220 (50 Zn) MG capsule Take 220 mg by mouth in the morning.   Yes [provider]  ? ?  ?Review of Systems ? ?Positive ROS: neg ? ?All other systems have been reviewed and were otherwise negative with the exception of those mentioned in the HPI and as above. ? ?Objective: ?Vital signs in last 24 hours: ?Temp:  [97.7 ?F (36.5 ?C)] 97.7 ?F (36.5 ?C) (05/10 7893) ?Pulse Rate:  [63] 63 (05/10 0711) ?Resp:  [17] 17 (05/10 0711) ?BP: (157)/(80) 157/80 (05/10 0711) ?SpO2:  [98 %] 98 % (05/10 0711) ?Weight:  [  102.1 kg] 102.1 kg (05/10 0711) ? ?General Appearance: Alert, cooperative, no distress, appears stated age ?Head: Normocephalic, without obvious abnormality, atraumatic ?Eyes: PERRL, conjunctiva/corneas clear, EOM's intact      ?Neck: Supple, symmetrical, trachea midline, ?Back: Symmetric, no curvature, ROM normal, no CVA tenderness ?Lungs:  respirations unlabored ?Heart: Regular rate and rhythm ?Abdomen: Soft, non-tender ?Extremities: Extremities normal, atraumatic, no cyanosis or edema ?Pulses: 2+ and symmetric all extremities ?Skin: Skin color, texture, turgor normal, no rashes or lesions ? ?NEUROLOGIC: ? ?Mental status:  Alert and oriented x4, no aphasia, good attention span, fund of knowledge and memory  ?Motor Exam - grossly normal ?Sensory Exam - grossly normal ?Reflexes: 1+ ?Coordination - grossly normal ?Gait - grossly normal ?Balance - grossly normal ?Cranial Nerves: ?I: smell Not tested  ?II: visual acuity  OS: nl    OD: nl  ?II: visual fields Full to confrontation  ?II: pupils Equal, round, reactive to light  ?III,VII: ptosis None  ?III,IV,VI: extraocular muscles  Full ROM  ?V: mastication Normal  ?V: facial light touch sensation  Normal  ?V,VII: corneal reflex  Present  ?VII: facial muscle function - upper  Normal  ?VII: facial muscle function - lower Normal  ?VIII: hearing Not tested  ?IX: soft palate elevation  Normal  ?IX,X: gag reflex Present  ?XI: trapezius strength  5/5  ?XI: sternocleidomastoid strength 5/5  ?XI: neck flexion strength  5/5  ?XII: tongue strength  Normal  ? ? ?Data Review ?Lab Results  ?Component Value Date  ? WBC 8.4 12/22/2021  ? HGB 14.6 12/22/2021  ? HCT 41.9 12/22/2021  ? MCV 92.3 12/22/2021  ? PLT 177 12/22/2021  ? ?Lab Results  ?Component Value Date  ? NA 140 12/22/2021  ? K 3.7 12/22/2021  ? CL 108 12/22/2021  ? CO2 25 12/22/2021  ? BUN 17 12/22/2021  ? CREATININE 1.19 12/22/2021  ? GLUCOSE 100 (H) 12/22/2021  ? ?Lab Results  ?Component Value Date  ? INR 1.0 12/22/2021  ? ? ?Assessment:  ? 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 Estimated body mass index is 30.52 kg/m? as calculated from the following: ?  Height as of this encounter: 6' (1.829 m). ?  Weight as of this encounter: 102.1 kg. ? Patient has failed conservative therapy. Planned surgery : posterior cervical C7-T1 foraminotomy with fusion ? ?Plan:  ? ?I explained the condition and procedure to the patient and answered any questions.  Patient wishes to proceed with procedure as planned. Understands risks/ benefits/ and expected or typical outcomes. ? ?Tia Alert ?12/29/2021 8:34 AM ? ?

## 2021-12-29 NOTE — Progress Notes (Signed)
Called to the patients bedside because the patient was having left sided facial numbness and a facial droop. His blood pressure was 207/90 upon arrival to the floor. Patient was alert oriented with no slurred speech or pronator drip, he was strong 5/5 in all extremities. Ordered 20mg  hydralazine before patient went down for CT scan. CT and CTA is unremarkable. Screws are in good place seen on cta.  ?

## 2021-12-30 ENCOUNTER — Encounter (HOSPITAL_COMMUNITY): Payer: Self-pay | Admitting: Neurological Surgery

## 2021-12-30 ENCOUNTER — Other Ambulatory Visit (HOSPITAL_COMMUNITY): Payer: Self-pay

## 2021-12-30 ENCOUNTER — Inpatient Hospital Stay (HOSPITAL_COMMUNITY): Payer: Medicare Other

## 2021-12-30 DIAGNOSIS — Z981 Arthrodesis status: Secondary | ICD-10-CM | POA: Diagnosis not present

## 2021-12-30 LAB — LIPID PANEL
Cholesterol: 158 mg/dL (ref 0–200)
HDL: 49 mg/dL (ref >40)
LDL Cholesterol: 93 mg/dL (ref 0–99)
Total CHOL/HDL Ratio: 3.2 RATIO
Triglycerides: 79 mg/dL (ref <150)
VLDL: 16 mg/dL (ref 0–40)

## 2021-12-30 MED ORDER — OXYCODONE-ACETAMINOPHEN 5-325 MG PO TABS
1.0000 | ORAL_TABLET | Freq: Four times a day (QID) | ORAL | 0 refills | Status: AC | PRN
  Filled 2021-12-30: qty 20, 5d supply, fill #0

## 2021-12-30 MED ORDER — METHOCARBAMOL 750 MG PO TABS: 750.0000 mg | ORAL_TABLET | Freq: Four times a day (QID) | ORAL | 0 refills | Status: DC

## 2021-12-30 MED ORDER — METHOCARBAMOL 500 MG PO TABS
500.0000 mg | ORAL_TABLET | Freq: Four times a day (QID) | ORAL | 0 refills | Status: AC | PRN
  Filled 2021-12-30: qty 45, 12d supply, fill #0

## 2021-12-30 MED ORDER — OXYCODONE-ACETAMINOPHEN 5-325 MG PO TABS: 1.0000 | ORAL_TABLET | ORAL | 0 refills | Status: DC | PRN

## 2021-12-30 NOTE — Progress Notes (Signed)
Physical Therapy Evaluation ?Patient Details ?Name: Joe SCHANK ?MRN: [id number] ?DOB: 08-02-1946 ?Today's Date: 12/30/2021 ? ?History of Present Illness ? 76 yo male s/p C7-T1 Decompressive bilateral hemilaminectomy, medial facetectomy, and foraminotomies on 5/10. L facial droop noted on 5/10 and rapid response called. MRI negative for changes.  PMH including arthritis, HTN, knee replacement (bilateral), and umbilical hernia.  ?Clinical Impression ? Pt is demonstrating high level balance changes but has symptoms from his new spinal fusion of pain and maybe a bit light headed from pain meds.  He is able to do things better with supervision, and is walking without help or AD.  Has climbed steps with no assist with a rail, and can do basic balancing without direct assistance.  Will recommend he go home with family to supervise, and will follow up with surgeon to see if further therapy needs emerge.  For now will see as his inpatient stay requires, focusing on balance and stair practice.  Follow for acute PT goals outlined below.  ?   ? ?Recommendations for follow up therapy are one component of a multi-disciplinary discharge planning process, led by the attending physician.  Recommendations may be updated based on patient status, additional functional criteria and insurance authorization. ? ?Follow Up Recommendations No PT follow up ? ?  ?Assistance Recommended at Discharge Set up Supervision/Assistance  ?Patient can return home with the following ? A little help with bathing/dressing/bathroom;Assistance with cooking/housework;Assist for transportation;Help with stairs or ramp for entrance ? ?  ?Equipment Recommendations None recommended by PT  ?Recommendations for Other Services ?    ?  ?Functional Status Assessment Patient has had a recent decline in their functional status and demonstrates the ability to make significant improvements in function in a reasonable and predictable amount of time.  ? ?  ?Precautions /  Restrictions Precautions ?Precautions: Cervical ?Precaution Booklet Issued: Yes (comment) ?Precaution Comments: Reviewed cervical precautions ?Required Braces or Orthoses: Other Brace ?Other Brace: requesting soft brace for comfort to rest in bed ?Restrictions ?Weight Bearing Restrictions: No  ? ?  ? ?Mobility ? Bed Mobility ?Overal bed mobility: Modified Independent ?  ?  ?  ?  ?  ?  ?  ?  ? ?Transfers ?Overall transfer level: Independent ?Equipment used: None ?  ?  ?  ?  ?  ?  ?  ?  ?  ? ?Ambulation/Gait ?Ambulation/Gait assistance: Modified independent (Device/Increase time) ?Gait Distance (Feet): 150 Feet ?Assistive device: None ?Gait Pattern/deviations: Step-through pattern, Wide base of support, Decreased stride length ?Gait velocity: reduced ?Gait velocity interpretation: <1.31 ft/sec, indicative of household ambulator ?Pre-gait activities: pt was walking when PT met him in hall ?General Gait Details: pt is walking on the hall in an exaggerated arm swing, agreeable to work on balance testing ? ?Stairs ?Stairs:  (had already performed) ?  ?  ?  ?  ? ?Wheelchair Mobility ?  ? ?Modified Rankin (Stroke Patients Only) ?  ? ?  ? ?Balance Overall balance assessment: Needs assistance ?Sitting-balance support: Feet supported ?Sitting balance-Leahy Scale: Good ?  ?  ?Standing balance support: No upper extremity supported ?Standing balance-Leahy Scale: Fair ?Standing balance comment: dynamic tasks are less than fair for upper Berg test skills ?  ?  ?  ?  ?  ?  ?  ?  ?  ?  ?  ?   ? ? ? ?Pertinent Vitals/Pain Pain Assessment ?Pain Assessment: Faces ?Faces Pain Scale: Hurts a little bit ?Pain Location: neck and upper shoulder on L ?  Pain Descriptors / Indicators: Guarding ?Pain Intervention(s): Monitored during session, Repositioned  ? ? ?Home Living Family/patient expects to be discharged to:: Private residence ?Living Arrangements: Spouse/significant other ?Available Help at Discharge: Family;Available 24 hours/day ?Type  of Home: House ?Home Access: Stairs to enter ?Entrance Stairs-Rails: Right ?Entrance Stairs-Number of Steps: 6 ?Alternate Level Stairs-Number of Steps: Flight ?Home Layout: Two level;Bed/bath upstairs ?Home Equipment: Grab bars - tub/shower;Rolling Walker (2 wheels);Shower seat ?   ?  ?Prior Function Prior Level of Function : Independent/Modified Independent ?  ?  ?  ?  ?  ?  ?Mobility Comments: no AD needed pre or post surgery ?  ?  ? ? ?Hand Dominance  ? Dominant Hand: Right ? ?  ?Extremity/Trunk Assessment  ? Upper Extremity Assessment ?Upper Extremity Assessment: Defer to OT evaluation ?  ? ?Lower Extremity Assessment ?Lower Extremity Assessment: Overall WFL for tasks assessed ?  ? ?Cervical / Trunk Assessment ?Cervical / Trunk Assessment: Neck Surgery  ?Communication  ? Communication: No difficulties  ?Cognition Arousal/Alertness: Awake/alert ?Behavior During Therapy: [facility] for tasks assessed/performed ?Overall Cognitive Status: Within Functional Limits for tasks assessed ?  ?  ?  ?  ?  ?  ?  ?  ?  ?  ?  ?  ?  ?  ?  ?  ?  ?  ?  ? ?  ?General Comments General comments (skin integrity, edema, etc.): family in to observe and ask questions ? ?  ?Exercises    ? ?Assessment/Plan  ?  ?PT Assessment Patient needs continued PT services  ?PT Problem List Decreased activity tolerance;Decreased balance;Decreased mobility;Pain ? ?   ?  ?PT Treatment Interventions DME instruction;Gait training;Stair training;Functional mobility training;Therapeutic activities;Therapeutic exercise;Balance training;Neuromuscular re-education;Patient/family education   ? ?PT Goals (Current goals can be found in the Care Plan section)  ?Acute Rehab PT Goals ?Patient Stated Goal: to get home today ?PT Goal Formulation: With patient/family ?Time For Goal Achievement: 01/02/22 ?Potential to Achieve Goals: Good ? ?  ?Frequency Min 5X/week ?  ? ? ?Co-evaluation   ?  ?  ?  ?  ? ? ?  ?AM-PAC PT "6 Clicks" Mobility  ?Outcome Measure Help needed turning  from your back to your side while in a flat bed without using bedrails?: None ?Help needed moving from lying on your back to sitting on the side of a flat bed without using bedrails?: None ?Help needed moving to and from a bed to a chair (including a wheelchair)?: A Little ?Help needed standing up from a chair using your arms (e.g., wheelchair or bedside chair)?: A Little ?Help needed to walk in hospital room?: A Little ?Help needed climbing 3-5 steps with a railing? : A Little ?6 Click Score: 20 ? ?  ?End of Session   ?Activity Tolerance: Patient tolerated treatment well;Patient limited by fatigue ?Patient left: in bed;with call bell/phone within reach;with family/visitor present ?Nurse Communication: Mobility status;Other (comment) (request for meds and soft neck brace) ?PT Visit Diagnosis: Other abnormalities of gait and mobility (R26.89);Other (comment) (balance changes with narrow BOS) ?  ? ?Time: 1740-8144 ?PT Time Calculation (min) (ACUTE ONLY): 25 min ? ? ?Charges:   PT Evaluation ?$PT Eval Moderate Complexity: 1 Mod ?PT Treatments ?$Neuromuscular Re-education: 8-22 mins ?  ?   ? ?Ramond Dial ?12/30/2021, 12:34 PM ? ?Mee Hives, PT PhD ?Acute Rehab Dept. Number: [facility] [phone] and [place] [phone] ? ?

## 2021-12-30 NOTE — Evaluation (Signed)
Occupational Therapy Evaluation ?Patient Details ?Name: Joe Ramos ?MRN: [id number] ?DOB: 04-Apr-1946 ?Today's Date: 12/30/2021 ? ? ?History of Present Illness 76 yo male s/p C7-T1 Decompressive bilateral hemilaminectomy, medial facetectomy, and foraminotomies on 5/10. L facial droop noted on 5/10 and rapid response called. MRI negative for changes.  PMH including arthritis, HTN, knee replacement (bilateral), and umbilical hernia.  ? ?Clinical Impression ?  ?PTA, pt was living with his wife and was independent. Currently, pt performing ADLs and functional mobility at Mod I level; use of RW in hallway from comfort per pt request due to "feeling woozy" after pain medicine. Provided education on cervical precautions, bed mobility, grooming, UB ADLs, LB ADLs, posture, tub transfer, stair management, and car transfer; pt demonstrated and verbalized understanding. Answered all pt questions. Recommend dc home once medically stable per physician. All acute OT needs met and will sign off. Thank you.  ?   ? ?Recommendations for follow up therapy are one component of a multi-disciplinary discharge planning process, led by the attending physician.  Recommendations may be updated based on patient status, additional functional criteria and insurance authorization.  ? ?Follow Up Recommendations ? No OT follow up  ?  ?Assistance Recommended at Discharge Intermittent Supervision/Assistance  ?Patient can return home with the following   ? ?  ?Functional Status Assessment ? Patient has had a recent decline in their functional status and demonstrates the ability to make significant improvements in function in a reasonable and predictable amount of time.  ?Equipment Recommendations ? None recommended by OT  ?  ?Recommendations for Other Services   ? ? ?  ?Precautions / Restrictions Precautions ?Precautions: Cervical ?Precaution Booklet Issued: Yes (comment) ?Precaution Comments: Reviewed cervical precautions ?Required Braces or  Orthoses: Other Brace ?Other Brace: No brace per MD order ?Restrictions ?Weight Bearing Restrictions: No  ? ?  ? ?Mobility Bed Mobility ?Overal bed mobility: Modified Independent ?  ?  ?  ?  ?  ?  ?General bed mobility comments: Pt performing log roll ?  ? ?Transfers ?Overall transfer level: Independent ?  ?  ?  ?  ?  ?  ?  ?  ?  ?  ? ?  ?Balance Overall balance assessment: No apparent balance deficits (not formally assessed) ?  ?  ?  ?  ?  ?  ?  ?  ?  ?  ?  ?  ?  ?  ?  ?  ?  ?  ?   ? ?ADL either performed or assessed with clinical judgement  ? ?ADL Overall ADL's : Modified independent ?  ?  ?  ?  ?  ?  ?  ?  ?  ?  ?  ?  ?  ?  ?  ?  ?  ?  ?  ?General ADL Comments: Pt performing ADLs and functional mobility at Mod I level with increased time as needed. Providing education on cervical precautions, bed mobility, grooming, UB ADLs, LB ADLs, posture, tub transfer, stair management, and car transfer.  ? ? ? ?Vision Baseline Vision/History: 1 Wears glasses ?   ?   ?Perception   ?  ?Praxis   ?  ? ?Pertinent Vitals/Pain Pain Assessment ?Pain Assessment: Faces ?Faces Pain Scale: Hurts a little bit ?Pain Location: Neck ?Pain Descriptors / Indicators: Discomfort ?Pain Intervention(s): Monitored during session  ? ? ? ?Hand Dominance Right ?  ?Extremity/Trunk Assessment Upper Extremity Assessment ?Upper Extremity Assessment: Overall WFL for tasks assessed ?  ?Lower Extremity Assessment ?  Lower Extremity Assessment: Overall WFL for tasks assessed ?  ?Cervical / Trunk Assessment ?Cervical / Trunk Assessment: Neck Surgery ?  ?Communication Communication ?Communication: No difficulties ?  ?Cognition Arousal/Alertness: Awake/alert ?Behavior During Therapy: [facility] for tasks assessed/performed ?Overall Cognitive Status: Within Functional Limits for tasks assessed ?  ?  ?  ?  ?  ?  ?  ?  ?  ?  ?  ?  ?  ?  ?  ?  ?  ?  ?  ?General Comments  Daughter and wife present throughout ? ?  ?Exercises   ?  ?Shoulder Instructions    ? ? ?Home Living  Family/patient expects to be discharged to:: Private residence ?Living Arrangements: Spouse/significant other ?Available Help at Discharge: Family;Available 24 hours/day ?Type of Home: House ?Home Access: Stairs to enter ?Entrance Stairs-Number of Steps: 6 ?Entrance Stairs-Rails: Right ?Home Layout: Two level;Bed/bath upstairs ?Alternate Level Stairs-Number of Steps: Flight ?Alternate Level Stairs-Rails: Right ?Bathroom Shower/Tub: Tub/shower unit ?  ?Bathroom Toilet: Standard ?  ?  ?Home Equipment: Grab bars - tub/shower;Rolling Walker (2 wheels);Shower seat ?  ?  ?  ? ?  ?Prior Functioning/Environment Prior Level of Function : Independent/Modified Independent ?  ?  ?  ?  ?  ?  ?  ?  ?  ? ?  ?  ?OT Problem List: Decreased activity tolerance;Decreased range of motion;Decreased knowledge of precautions;Decreased knowledge of use of DME or AE ?  ?   ?OT Treatment/Interventions:    ?  ?OT Goals(Current goals can be found in the care plan section) Acute Rehab OT Goals ?Patient Stated Goal: Go home ?OT Goal Formulation: All assessment and education complete, DC therapy  ?OT Frequency:   ?  ? ?Co-evaluation   ?  ?  ?  ?  ? ?  ?AM-PAC OT "6 Clicks" Daily Activity     ?Outcome Measure Help from another person eating meals?: None ?Help from another person taking care of personal grooming?: None ?Help from another person toileting, which includes using toliet, bedpan, or urinal?: None ?Help from another person bathing (including washing, rinsing, drying)?: None ?Help from another person to put on and taking off regular upper body clothing?: None ?Help from another person to put on and taking off regular lower body clothing?: None ?6 Click Score: 24 ?  ?End of Session Equipment Utilized During Treatment: Rolling walker (2 wheels) (RW for comfort as pt feeling woozy from pain meds) ?Nurse Communication: Mobility status ? ?Activity Tolerance: Patient tolerated treatment well ?Patient left: in bed;with call bell/phone within  reach;with family/visitor present ? ?OT Visit Diagnosis: Other abnormalities of gait and mobility (R26.89);Muscle weakness (generalized) (M62.81)  ?              ?Time: 2446-2863 ?OT Time Calculation (min): 23 min ?Charges:  OT General Charges ?$OT Visit: 1 Visit ?OT Evaluation ?$OT Eval Low Complexity: 1 Low ?OT Treatments ?$Self Care/Home Management : 8-22 mins ? ?Alesha Jaffee MSOT, OTR/L ?Acute Rehab ?Pager: [phone] ?Office: [phone] ? ?Shamell Hittle M Roshini Fulwider ?12/30/2021, 9:07 AM ?

## 2021-12-30 NOTE — Progress Notes (Addendum)
STROKE TEAM PROGRESS NOTE  ? ?INTERVAL HISTORY ?His wife and daughter are at the bedside.  Patient stated he noticed slight weakness at the left corner of the mouth with some Novocain-like sensation there.  He wonders if this was due to the positioning of the ET tube pressing on the left from his spine surgery.  Denies weakness of his cheeks, numbness/or any other focal symptoms.  MRI scan is negative for acute stroke.  Echocardiogram is pending. ? ?Vitals:  ? 12/29/21 2227 12/30/21 0131 12/30/21 0756 12/30/21 0800  ?BP: (!) 157/72 (!) 158/64 126/65   ?Pulse: 82  67   ?Resp: 18 17 16    ?Temp:  99.4 ?F (37.4 ?C) 98.2 ?F (36.8 ?C)   ?TempSrc:  Oral Oral   ?SpO2: 98% 97% 94% 94%  ?Weight:      ?Height:      ? ?CBC: No results for input(s): WBC, NEUTROABS, HGB, HCT, MCV, PLT in the last 168 hours. ?Basic Metabolic Panel: No results for input(s): NA, K, CL, CO2, GLUCOSE, BUN, CREATININE, CALCIUM, MG, PHOS in the last 168 hours. ?Lipid Panel:  ?Recent Labs  ?Lab 12/30/21 ?03/01/22  ?CHOL 158  ?TRIG 79  ?HDL 49  ?CHOLHDL 3.2  ?VLDL 16  ?LDLCALC 93  ? ?HgbA1c:  ?Recent Labs  ?Lab 12/29/21 ?1704  ?HGBA1C 5.7*  ? ?Urine Drug Screen: No results for input(s): LABOPIA, COCAINSCRNUR, LABBENZ, AMPHETMU, THCU, LABBARB in the last 168 hours.  ?Alcohol Level No results for input(s): ETH in the last 168 hours. ? ?IMAGING past 24 hours ?DG Cervical Spine 2 or 3 views ? ?Result Date: 12/29/2021 ?CLINICAL DATA:  Posterior cervical fusion EXAM: CERVICAL SPINE - 2-3 VIEW COMPARISON:  MRI 10/11/2021 FINDINGS: Multiple C-arm images are provided for localization. There is performance of posterior fusion with pedicle screws. Exact level difficult to identify with certainty, but I think the lower screws are at the T1 level. IMPRESSION: Posterior cervical fusion. Limited detail. I think that the lower screws are at the T1 level. Electronically Signed   By: 10/13/2021 M.D.   On: 12/29/2021 11:16  ? ?MR BRAIN WO CONTRAST ? ?Result Date:  12/30/2021 ?CLINICAL DATA:  Follow-up examination for acute stroke. EXAM: MRI HEAD WITHOUT CONTRAST TECHNIQUE: Multiplanar, multiecho pulse sequences of the brain and surrounding structures were obtained without intravenous contrast. COMPARISON:  Prior CTs from 12/29/2021. FINDINGS: Brain: Cerebral volume within normal limits for age. Scattered patchy T2/FLAIR hyperintensity involving the supratentorial cerebral white matter, most likely related chronic microvascular ischemic disease, mild in nature. Remote cortical subcortical infarct noted at the anterior right frontal lobe. No evidence for acute or subacute ischemia. Gray-white matter differentiation otherwise maintained. No other areas of chronic cortical infarction. No acute or chronic intracranial blood products. No mass lesion, midline shift or mass effect. No hydrocephalus or extra-axial fluid collection. Pituitary gland suprasellar region within normal limits. Midline structures intact and normal. Vascular: Major intracranial vascular flow voids are maintained. Skull and upper cervical spine: Craniocervical junction within normal limits. Bone marrow signal intensity normal. No scalp soft tissue abnormality. Sinuses/Orbits: Globes and orbital soft tissues within normal limits. Paranasal sinuses are largely clear. Small right mastoid effusion noted, of doubtful significance. Other: None. IMPRESSION: 1. No acute intracranial abnormality. 2. 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 Electronically Signed   By: 02/28/2022 M.D.   On: 12/30/2021 03:17  ? ?DG C-Arm 1-60 Min-No Report ? ?Result Date: 12/29/2021 ?Fluoroscopy was utilized by the requesting physician.  No radiographic interpretation.  ? ?DG C-Arm 1-60 Min-No Report ? ?Result Date: 12/29/2021 ?Fluoroscopy was utilized by the requesting physician.  No radiographic interpretation.  ? ?CT HEAD CODE STROKE WO CONTRAST ? ?Result Date:  12/29/2021 ?CLINICAL DATA:  Code stroke.  Left facial droop EXAM: CT HEAD WITHOUT CONTRAST TECHNIQUE: Contiguous axial images were obtained from the base of the skull through the vertex without intravenous contrast. RADIATION DOSE REDUCTION: This exam was performed according to the departmental dose-optimization program which includes automated exposure control, adjustment of the mA and/or kV according to patient size and/or use of iterative reconstruction technique. COMPARISON:  None Available. FINDINGS: Brain: No evidence of acute infarction, hemorrhage, cerebral edema, mass, mass effect, or midline shift. Ventricles and sulci are normal for age. No extra-axial fluid collection. Remote infarct in the right frontal lobe. Vascular: No hyperdense vessel. Atherosclerotic calcifications in the intracranial carotid and vertebral arteries. Skull: Normal. Negative for fracture or focal lesion. Sinuses/Orbits: No acute finding. Other: Trace fluid in right mastoid air cells. ASPECTS [facility]([place] Stroke Program Early CT Score) - Ganglionic level infarction (caudate, lentiform nuclei, internal capsule, insula, M1-M3 cortex): 7 - Supraganglionic infarction (M4-M6 cortex): 3 Total score (0-10 with 10 being normal): 10 IMPRESSION: 1. No acute intracranial process. 2. ASPECTS is 10 Code stroke imaging results were communicated on 12/29/2021 at 1:55 pm to provider Dr. Selina CooleyStack via secure text paging. Electronically Signed   By: Wiliam KeAlison  Vasan M.D.   On: 12/29/2021 13:57  ? ?CT ANGIO HEAD NECK W WO CM (CODE STROKE) ? ?Result Date: 12/29/2021 ?CLINICAL DATA:  Acute neuro deficit rule out stroke. Posterior cervical fusion today. EXAM: CT ANGIOGRAPHY HEAD AND NECK TECHNIQUE: Multidetector CT imaging of the head and neck was performed using the standard protocol during bolus administration of intravenous contrast. Multiplanar CT image reconstructions and MIPs were obtained to evaluate the vascular anatomy. Carotid stenosis measurements (when  applicable) are obtained utilizing NASCET criteria, using the distal internal carotid diameter as the denominator. RADIATION DOSE REDUCTION: This exam was performed according to the departmental dose-optimization program which includes automated exposure control, adjustment of the mA and/or kV according to patient size and/or use of iterative reconstruction technique. CONTRAST:  75mL OMNIPAQUE IOHEXOL 350 MG/ML SOLN COMPARISON:  CT head 12/29/2021 FINDINGS: CTA NECK FINDINGS Aortic arch: Standard branching. Imaged portion shows no evidence of aneurysm or dissection. No significant stenosis of the major arch vessel origins. Right carotid system: Atherosclerotic calcification right carotid bifurcation. 25% diameter stenosis proximal right internal carotid artery. Right external carotid widely patent Left carotid system: Mild atherosclerotic disease left carotid bifurcation without stenosis Vertebral arteries: Both vertebral arteries widely patent to the skull base. No stenosis or dissection identified. Skeleton: Posterior pedicle screw and rod fusion at C7-T1. Hardware in satisfactory position. No acute skeletal abnormality. Other neck: Gas in the posterior soft tissues of the neck due to surgery today. No hematoma or fluid collection. Upper chest: Lung apices clear bilaterally Review of the MIP images confirms the above findings CTA HEAD FINDINGS Anterior circulation: Mild atherosclerotic calcification in the cavernous carotid bilaterally without stenosis. Anterior and middle cerebral arteries patent bilaterally without stenosis or aneurysm. No large vessel occlusion Posterior circulation: Atherosclerotic calcification and mild stenosis of the vertebral artery bilaterally just above the foramen magnum. Both vertebral arteries patent to the basilar. PICA patent bilaterally. Basilar widely patent. Superior cerebellar and posterior cerebral arteries patent bilaterally without stenosis or large vessel occlusion. No  aneurysm. Venous sinuses: Normal venous enhancement. Anatomic variants: None Review of  the MIP images confirms the above findings IMPRESSION: 1. Negative for intracranial large vessel occlusion or significant stenosis. 2. Atheroscleroti

## 2021-12-30 NOTE — Progress Notes (Signed)
Pt admitted for Cervical Spinal Fusion and procedure completed without complications, participated and completed all therapy sessions. Progress sufficient for discharge to home per MD. Tele monitor discontinued and removed. IV sites to left arm and right hand discontinued with catheter tips intact to both sites, no s/s of infection, guaze dressing in place. Personal belongings given to patient/daughter at bedside.  Teaching completed with verbalized understanding voiced. Medications refills completed and given to patient prior to discharge. Patient discharge to home via personal care with daughter at his side. ?

## 2021-12-30 NOTE — Discharge Summary (Addendum)
Physician Discharge Summary  ?Patient ID: ?Joe Ramos ?MRN: [id number] ?DOB/AGE: March 30, 1946 76 y.o. ? ?Admit date: 12/29/2021 ?Discharge date: 12/30/2021 ? 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   ? ? ?Discharge Diagnoses: same ? ? ?Discharged Condition: good ? ?Hospital Course: The patient was admitted on 12/29/2021 and taken to the operating room where the patient underwent posterior cervical decompression and fusion C7-T1. The patient tolerated the procedure well and was taken to the recovery room and then to the floor in stable condition. The hospital course was routine. There were no complications. The wound remained clean dry and intact. Pt had appropriate neck soreness. No complaints of arm pain or new N/T/W. The patient remained afebrile with stable vital signs, and tolerated a regular diet. The patient continued to increase activities, and pain was well controlled with oral pain medications.  ? ?Consults: None ? ?Significant Diagnostic Studies:  ?Results for orders placed or performed during the hospital encounter of 12/29/21  ?Glucose, capillary  ?Result Value Ref Range  ? Glucose-Capillary 176 (H) 70 - 99 mg/dL  ?Hemoglobin A1c  ?Result Value Ref Range  ? Hgb A1c MFr Bld 5.7 (H) 4.8 - 5.6 %  ? Mean Plasma Glucose 116.89 mg/dL  ?Lipid panel  ?Result Value Ref Range  ? Cholesterol 158 0 - 200 mg/dL  ? Triglycerides 79 <150 mg/dL  ? HDL 49 >40 mg/dL  ? Total CHOL/HDL Ratio 3.2 RATIO  ? VLDL 16 0 - 40 mg/dL  ? LDL Cholesterol 93 0 - 99 mg/dL  ? ? ?DG Cervical Spine 2 or 3 views ? ?Result Date: 12/29/2021 ?CLINICAL DATA:  Posterior cervical fusion EXAM: CERVICAL SPINE - 2-3 VIEW COMPARISON:  MRI 10/11/2021 FINDINGS: Multiple C-arm images are provided for localization. There is performance of posterior fusion with pedicle screws. Exact level difficult to identify with certainty, but I think the lower screws are at the T1 level.  IMPRESSION: Posterior cervical fusion. Limited detail. I think that the lower screws are at the T1 level. Electronically Signed   By: Nelson Chimes M.D.   On: 12/29/2021 11:16  ? ?MR BRAIN WO CONTRAST ? ?Result Date: 12/30/2021 ?CLINICAL DATA:  Follow-up examination for acute stroke. EXAM: MRI HEAD WITHOUT CONTRAST TECHNIQUE: Multiplanar, multiecho pulse sequences of the brain and surrounding structures were obtained without intravenous contrast. COMPARISON:  Prior CTs from 12/29/2021. FINDINGS: Brain: Cerebral volume within normal limits for age. Scattered patchy T2/FLAIR hyperintensity involving the supratentorial cerebral white matter, most likely related chronic microvascular ischemic disease, mild in nature. Remote cortical subcortical infarct noted at the anterior right frontal lobe. No evidence for acute or subacute ischemia. Gray-white matter differentiation otherwise maintained. No other areas of chronic cortical infarction. No acute or chronic intracranial blood products. No mass lesion, midline shift or mass effect. No hydrocephalus or extra-axial fluid collection. Pituitary gland suprasellar region within normal limits. Midline structures intact and normal. Vascular: Major intracranial vascular flow voids are maintained. Skull and upper cervical spine: Craniocervical junction within normal limits. Bone marrow signal intensity normal. No scalp soft tissue abnormality. Sinuses/Orbits: Globes and orbital soft tissues within normal limits. Paranasal sinuses are largely clear. Small right mastoid effusion noted, of doubtful significance. Other: None. IMPRESSION: 1. No acute intracranial abnormality. 2. 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 Electronically Signed   By: Jeannine Boga M.D.   On: 12/30/2021 03:17  ? ?DG C-Arm 1-60 Min-No Report ? ?Result Date: 12/29/2021 ?  Fluoroscopy was utilized by the requesting physician.  No radiographic  interpretation.  ? ?DG C-Arm 1-60 Min-No Report ? ?Result Date: 12/29/2021 ?Fluoroscopy was utilized by the requesting physician.  No radiographic interpretation.  ? ?CT HEAD CODE STROKE WO CONTRAST ? ?Result Date: 12/29/2021 ?CLINICAL DATA:  Code stroke.  Left facial droop EXAM: CT HEAD WITHOUT CONTRAST TECHNIQUE: Contiguous axial images were obtained from the base of the skull through the vertex without intravenous contrast. RADIATION DOSE REDUCTION: This exam was performed according to the departmental dose-optimization program which includes automated exposure control, adjustment of the mA and/or kV according to patient size and/or use of iterative reconstruction technique. COMPARISON:  None Available. FINDINGS: Brain: No evidence of acute infarction, hemorrhage, cerebral edema, mass, mass effect, or midline shift. Ventricles and sulci are normal for age. No extra-axial fluid collection. Remote infarct in the right frontal lobe. Vascular: No hyperdense vessel. Atherosclerotic calcifications in the intracranial carotid and vertebral arteries. Skull: Normal. Negative for fracture or focal lesion. Sinuses/Orbits: No acute finding. Other: Trace fluid in right mastoid air cells. ASPECTS [facility] Stroke Program Early CT Score) - Ganglionic level infarction (caudate, lentiform nuclei, internal capsule, insula, M1-M3 cortex): 7 - Supraganglionic infarction (M4-M6 cortex): 3 Total score (0-10 with 10 being normal): 10 IMPRESSION: 1. No acute intracranial process. 2. ASPECTS is 10 Code stroke imaging results were communicated on 12/29/2021 at 1:55 pm to provider Dr. Quinn Axe via secure text paging. Electronically Signed   By: Merilyn Baba M.D.   On: 12/29/2021 13:57  ? ?CT ANGIO HEAD NECK W WO CM (CODE STROKE) ? ?Result Date: 12/29/2021 ?CLINICAL DATA:  Acute neuro deficit rule out stroke. Posterior cervical fusion today. EXAM: CT ANGIOGRAPHY HEAD AND NECK TECHNIQUE: Multidetector CT imaging of the head and neck was performed  using the standard protocol during bolus administration of intravenous contrast. Multiplanar CT image reconstructions and MIPs were obtained to evaluate the vascular anatomy. Carotid stenosis measurements (when applicable) are obtained utilizing NASCET criteria, using the distal internal carotid diameter as the denominator. RADIATION DOSE REDUCTION: This exam was performed according to the departmental dose-optimization program which includes automated exposure control, adjustment of the mA and/or kV according to patient size and/or use of iterative reconstruction technique. CONTRAST:  35mL OMNIPAQUE IOHEXOL 350 MG/ML SOLN COMPARISON:  CT head 12/29/2021 FINDINGS: CTA NECK FINDINGS Aortic arch: Standard branching. Imaged portion shows no evidence of aneurysm or dissection. No significant stenosis of the major arch vessel origins. Right carotid system: Atherosclerotic calcification right carotid bifurcation. 25% diameter stenosis proximal right internal carotid artery. Right external carotid widely patent Left carotid system: Mild atherosclerotic disease left carotid bifurcation without stenosis Vertebral arteries: Both vertebral arteries widely patent to the skull base. No stenosis or dissection identified. Skeleton: Posterior pedicle screw and rod fusion at C7-T1. Hardware in satisfactory position. No acute skeletal abnormality. Other neck: Gas in the posterior soft tissues of the neck due to surgery today. No hematoma or fluid collection. Upper chest: Lung apices clear bilaterally Review of the MIP images confirms the above findings CTA HEAD FINDINGS Anterior circulation: Mild atherosclerotic calcification in the cavernous carotid bilaterally without stenosis. Anterior and middle cerebral arteries patent bilaterally without stenosis or aneurysm. No large vessel occlusion Posterior circulation: Atherosclerotic calcification and mild stenosis of the vertebral artery bilaterally just above the foramen magnum. Both  vertebral arteries patent to the basilar. PICA patent bilaterally. Basilar widely patent. Superior cerebellar and posterior cerebral arteries patent bilaterally without stenosis or large vessel occlusion. No aneury

## 2021-12-30 NOTE — TOC Transition Note (Signed)
Transition of Care (TOC) - CM/SW Discharge Note ? ? ?Patient Details  ?Name: Joe Ramos ?MRN: [id number] ?Date of Birth: June 26, 1946 ? ?Transition of Care (TOC) CM/SW Contact:  ?Pollie Friar, RN ?Phone Number: ?12/30/2021, 10:01 AM ? ? ?Clinical Narrative:    ?Patient discharging home with self care. No f/u per therapy.  ?Pt has transportation home.  ? ? ?Final next level of care: Home/Self Care ?Barriers to Discharge: No Barriers Identified ? ? ?Patient Goals and CMS Choice ?  ?  ?  ? ?Discharge Placement ?  ?           ?  ?  ?  ?  ? ?Discharge Plan and Services ?  ?  ?           ?  ?  ?  ?  ?  ?  ?  ?  ?  ?  ? ?Social Determinants of Health (SDOH) Interventions ?  ? ? ?Readmission Risk Interventions ?   ? View : No data to display.  ?  ?  ?  ? ? ? ? ? ?
